# Patient Record
Sex: Female | Born: 1950
Health system: Southern US, Community
[De-identification: ages and names within clinical notes are randomized; demographics above are authoritative.]

## PROBLEM LIST (undated history)

## (undated) DIAGNOSIS — G5682 Other specified mononeuropathies of left upper limb: Secondary | ICD-10-CM

## (undated) DIAGNOSIS — Z9289 Personal history of other medical treatment: Secondary | ICD-10-CM

## (undated) DIAGNOSIS — R079 Chest pain, unspecified: Secondary | ICD-10-CM

## (undated) DIAGNOSIS — M069 Rheumatoid arthritis, unspecified: Secondary | ICD-10-CM

## (undated) HISTORY — DX: Rheumatoid arthritis, unspecified: M06.9

## (undated) HISTORY — DX: Chest pain, unspecified: R07.9

## (undated) HISTORY — PX: CARPAL TUNNEL RELEASE: SHX101

## (undated) HISTORY — PX: CORONARY ANGIOPLASTY: SHX604

## (undated) HISTORY — DX: Personal history of other medical treatment: Z92.89

## (undated) HISTORY — DX: Other specified mononeuropathies of left upper limb: G56.82

---

## 1998-06-03 ENCOUNTER — Other Ambulatory Visit: Admission: RE | Admit: 1998-06-03 | Discharge: 1998-06-03 | Payer: Self-pay | Admitting: Obstetrics and Gynecology

## 1998-10-21 ENCOUNTER — Other Ambulatory Visit: Admission: RE | Admit: 1998-10-21 | Discharge: 1998-10-21 | Payer: Self-pay | Admitting: Obstetrics and Gynecology

## 1999-03-14 ENCOUNTER — Other Ambulatory Visit: Admission: RE | Admit: 1999-03-14 | Discharge: 1999-03-14 | Payer: Self-pay | Admitting: Obstetrics and Gynecology

## 1999-07-14 ENCOUNTER — Other Ambulatory Visit: Admission: RE | Admit: 1999-07-14 | Discharge: 1999-07-14 | Payer: Self-pay | Admitting: Obstetrics and Gynecology

## 2000-06-07 ENCOUNTER — Other Ambulatory Visit: Admission: RE | Admit: 2000-06-07 | Discharge: 2000-06-07 | Payer: Self-pay | Admitting: Obstetrics and Gynecology

## 2001-01-26 ENCOUNTER — Other Ambulatory Visit: Admission: RE | Admit: 2001-01-26 | Discharge: 2001-01-26 | Payer: Self-pay | Admitting: Obstetrics and Gynecology

## 2001-05-30 ENCOUNTER — Other Ambulatory Visit: Admission: RE | Admit: 2001-05-30 | Discharge: 2001-05-30 | Payer: Self-pay | Admitting: Obstetrics and Gynecology

## 2002-06-12 ENCOUNTER — Other Ambulatory Visit: Admission: RE | Admit: 2002-06-12 | Discharge: 2002-06-12 | Payer: Self-pay | Admitting: Obstetrics and Gynecology

## 2002-12-26 ENCOUNTER — Encounter: Admission: RE | Admit: 2002-12-26 | Discharge: 2002-12-26 | Payer: Self-pay | Admitting: Sports Medicine

## 2003-06-15 ENCOUNTER — Other Ambulatory Visit: Admission: RE | Admit: 2003-06-15 | Discharge: 2003-06-15 | Payer: Self-pay | Admitting: Obstetrics and Gynecology

## 2004-06-16 ENCOUNTER — Other Ambulatory Visit: Admission: RE | Admit: 2004-06-16 | Discharge: 2004-06-16 | Payer: Self-pay | Admitting: Obstetrics and Gynecology

## 2004-12-10 ENCOUNTER — Encounter (INDEPENDENT_AMBULATORY_CARE_PROVIDER_SITE_OTHER): Payer: Self-pay | Admitting: Specialist

## 2004-12-10 ENCOUNTER — Ambulatory Visit (HOSPITAL_BASED_OUTPATIENT_CLINIC_OR_DEPARTMENT_OTHER): Admission: RE | Admit: 2004-12-10 | Discharge: 2004-12-10 | Payer: Self-pay | Admitting: Orthopedic Surgery

## 2004-12-10 ENCOUNTER — Ambulatory Visit (HOSPITAL_COMMUNITY): Admission: RE | Admit: 2004-12-10 | Discharge: 2004-12-10 | Payer: Self-pay | Admitting: Orthopedic Surgery

## 2004-12-28 HISTORY — PX: TENDON RELEASE: SHX230

## 2006-02-25 ENCOUNTER — Encounter: Admission: RE | Admit: 2006-02-25 | Discharge: 2006-02-25 | Payer: Self-pay | Admitting: *Deleted

## 2006-07-07 ENCOUNTER — Other Ambulatory Visit: Admission: RE | Admit: 2006-07-07 | Discharge: 2006-07-07 | Payer: Self-pay | Admitting: Obstetrics & Gynecology

## 2008-03-30 ENCOUNTER — Other Ambulatory Visit: Admission: RE | Admit: 2008-03-30 | Discharge: 2008-03-30 | Payer: Self-pay | Admitting: Obstetrics & Gynecology

## 2008-05-30 ENCOUNTER — Ambulatory Visit: Payer: Self-pay | Admitting: Sports Medicine

## 2008-05-30 DIAGNOSIS — M7750 Other enthesopathy of unspecified foot: Secondary | ICD-10-CM | POA: Insufficient documentation

## 2008-05-30 DIAGNOSIS — M069 Rheumatoid arthritis, unspecified: Secondary | ICD-10-CM | POA: Insufficient documentation

## 2009-06-20 ENCOUNTER — Ambulatory Visit: Payer: Self-pay | Admitting: Sports Medicine

## 2009-06-20 DIAGNOSIS — S838X9A Sprain of other specified parts of unspecified knee, initial encounter: Secondary | ICD-10-CM | POA: Insufficient documentation

## 2009-06-20 DIAGNOSIS — S86819A Strain of other muscle(s) and tendon(s) at lower leg level, unspecified leg, initial encounter: Secondary | ICD-10-CM

## 2011-05-15 NOTE — Op Note (Signed)
NAME:  Barbara Ayers, Barbara Ayers                  ACCOUNT NO.:  [id number]   MEDICAL RECORD NO.:  [id number]          PATIENT TYPE:  AMB   LOCATION:  DSC                          FACILITY:  [facility]   PHYSICIAN:  Cindee Salt, M.D.       DATE OF BIRTH:  10/19/1951   DATE OF PROCEDURE:  12/10/2004  DATE OF DISCHARGE:                                 OPERATIVE REPORT   PREOPERATIVE DIAGNOSIS:  Extensor tenosynovitis, right wrist.   POSTOPERATIVE DIAGNOSIS:  Extensor tenosynovitis, right wrist.   OPERATION:  Extensor tenosynovectomy, third and fourth dorsal compartment of  right wrist.   SURGEON:  Cindee Salt, M.D.   Threasa HeadsCarolyne Fiscal.   ANESTHESIA:  IV regional.   HISTORY:  The patient is a 60 year old female with a history of rheumatoid  arthritis, who has had swelling of the extensor tendons of her right wrist  that has not responded to conservative treatment.   PROCEDURE:  The patient was brought to the operating room, where an upper  arm IV regional anesthetic was carried out without difficulty.  She was  prepped using DuraPrep in the supine position, right arm free.  The limb was  exsanguinated with an Esmarch bandage, the tourniquet placed high in the arm  was inflated to 250 mmHg.  A longitudinal incision was made over the dorsal  aspect of her wrist, carried down through subcutaneous tissue.  Significant  swelling of the tenosynovial tissue was immediately apparent both proximal  and distal to the extensor retinaculum.  The retinaculum was opened  distally, proximally.  The synovial tissue was then removed with blunt and  sharp dissection with the use of a rongeur, stripping each of the extensor  tendons from the third and fourth dorsal compartment.  There was  infiltration into the middle finger extensor tendon.  This was removed as  much as possible.  This was done both proximally and distally.  The wound  was copiously irrigated with saline.  A vessel loop was placed through the  depths  of the wound and the skin closed with subcuticular 4-0 Monocryl  suture.  Steri-Strips were applied.  A sterile compressive dressing and  splint were applied.  The patient tolerated the procedure well and was taken  to the recovery room for observation in satisfactory condition.  She will be  discharged on Vicodin.  It should be noted that cultures were taken for  aerobic and aerobic, fungal cultures.  Rice bodies were noted on opening of  the synovium.       GK/MEDQ  D:  12/10/2004  T:  12/10/2004  Job:  [id number]

## 2011-08-14 LAB — HM MAMMOGRAPHY: HM Mammogram: NEGATIVE

## 2011-09-15 ENCOUNTER — Ambulatory Visit (INDEPENDENT_AMBULATORY_CARE_PROVIDER_SITE_OTHER): Payer: BC Managed Care – PPO | Admitting: Sports Medicine

## 2011-09-15 VITALS — BP 110/70

## 2011-09-15 DIAGNOSIS — M79673 Pain in unspecified foot: Secondary | ICD-10-CM

## 2011-09-15 DIAGNOSIS — M069 Rheumatoid arthritis, unspecified: Secondary | ICD-10-CM

## 2011-09-15 DIAGNOSIS — M7750 Other enthesopathy of unspecified foot: Secondary | ICD-10-CM

## 2011-09-15 DIAGNOSIS — M79609 Pain in unspecified limb: Secondary | ICD-10-CM

## 2011-09-15 NOTE — Assessment & Plan Note (Signed)
Stable on MTX  Needs orthotics to cont her exercise program

## 2011-09-15 NOTE — Assessment & Plan Note (Signed)
Patient was fitted for a standard, cushioned, semi-rigid orthotic.  The orthotic was heated and the patient stood on the orthotic blank positioned on the orthotic stand. The patient was positioned in subtalar neutral position and 10 degrees of ankle dorsiflexion in a weight bearing stance. After molding, a stable Fast-Tech EVA base was applied to the orthotic blank.   The blank was ground to a stable position for weight bearing. Size: 10 blue swirl base: Blue EVA posting: first ray left additional orthotic padding: MT pads  Time 40 mins  Gait post orthotics is neutral in walking and running and she has good comfort  RTC prn

## 2011-09-15 NOTE — Progress Notes (Signed)
  Subjective:    Patient ID: KAELEY VINJE, female    DOB: 1951/09/27, 60 y.o.   MRN: [id number]  HPI 60 year old F with hx of: Bilateral foot pain Metatarsalgia Rheumatoid arthritis  About 4 years ago we put her into MT pads and soft orthotics This allowed her to run and walk without flares of her RA  3 years and 3 mos ago placed in custom orthotic She has done very well with these Able to walk/run 30 MPW with these Gets recurrent Foot pain if not using these   Review of Systems     Objective:   Physical Exam NAD  Loss of transverse arch bilat Less morton's callus than in past Leg length equal  Arch shape is somewhat cavus  No ankle effusions No swelling of MTP joints today  Mild hypertrophy of MCP 2 and 3 bilat       Assessment & Plan:

## 2011-09-15 NOTE — Assessment & Plan Note (Signed)
MT pads added to current orthotics  Cont these in all walking and exerc shoes

## 2012-08-25 LAB — HM PAP SMEAR: HM Pap smear: NEGATIVE

## 2013-08-29 ENCOUNTER — Ambulatory Visit: Payer: Self-pay | Admitting: Nurse Practitioner

## 2013-09-22 ENCOUNTER — Encounter: Payer: Self-pay | Admitting: Nurse Practitioner

## 2013-09-25 ENCOUNTER — Telehealth: Payer: Self-pay | Admitting: Nurse Practitioner

## 2013-09-25 ENCOUNTER — Ambulatory Visit (INDEPENDENT_AMBULATORY_CARE_PROVIDER_SITE_OTHER): Payer: BC Managed Care – PPO | Admitting: Nurse Practitioner

## 2013-09-25 ENCOUNTER — Encounter: Payer: Self-pay | Admitting: Nurse Practitioner

## 2013-09-25 VITALS — BP 120/66 | HR 68 | Resp 16 | Ht 67.25 in | Wt 138.0 lb

## 2013-09-25 DIAGNOSIS — Z01419 Encounter for gynecological examination (general) (routine) without abnormal findings: Secondary | ICD-10-CM

## 2013-09-25 DIAGNOSIS — Z1211 Encounter for screening for malignant neoplasm of colon: Secondary | ICD-10-CM

## 2013-09-25 DIAGNOSIS — Z0000 Encounter for general adult medical examination without abnormal findings: Secondary | ICD-10-CM

## 2013-09-25 LAB — LIPID PANEL
Cholesterol: 163 mg/dL (ref 0–200)
HDL: 56 mg/dL (ref >39)
Triglycerides: 82 mg/dL (ref <150)
VLDL: 16 mg/dL (ref 0–40)

## 2013-09-25 LAB — TSH: TSH: 2.623 u[IU]/mL (ref 0.350–4.500)

## 2013-09-25 NOTE — Progress Notes (Signed)
Patient ID: Barbara Ayers, female   DOB: [birth year], 62 y.o.   MRN: [id number] 62 y.o. G2P2002 Divorced Caucasian Fe here for annual exam.   No vaso symptoms. Not sexually active. Doing OK since divorce. Wants labs done. She is also concerned about her BMD, her younger sister was diagnosed with borderline osteoporosis and is no Reclast.  No LMP recorded. Patient is postmenopausal.          Sexually active: no  The current method of family planning is abstinence.    Exercising: yes  Gym routine that includes yoga once a week and home routine that includes jogging and walking everyday. Smoker:  no  Health Maintenance: Pap:  8.29.13, WNL, neg HR HPV MMG:  08/14/11, BI-Rads 1: negative Colonoscopy:  never BMD:   4/115/09, normal TDaP:  05/2009 Labs: would like cholesterol checked today.  Declined iron and urine check.   reports that she has never smoked. She has never used smokeless tobacco. She reports that she does not drink alcohol or use illicit drugs.  Past Medical History  Diagnosis Date  . Tendonitis     hands  . RA (rheumatoid arthritis)   . Transfusion history     Past Surgical History  Procedure Laterality Date  . Tendon release Right 2006  . Carpal tunnel release      Current Outpatient Prescriptions  Medication Sig Dispense Refill  . Ascorbic Acid (VITAMIN C) 100 MG tablet Take 100 mg by mouth daily.      Marland Kitchen CALCIUM PO Take 1 tablet by mouth daily.      . Cholecalciferol (VITAMIN D) 2000 UNITS CAPS Take by mouth.      . cycloSPORINE (RESTASIS) 0.05 % ophthalmic emulsion 1 drop 2 (two) times daily.      . fish oil-omega-3 fatty acids 1000 MG capsule Take 2 g by mouth daily.      . folic acid (FOLVITE) 1 MG tablet Take 1 mg by mouth daily.      . methotrexate (RHEUMATREX) 2.5 MG tablet Take 10 mg by mouth once a week. Caution:Chemotherapy. Protect from light.       No current facility-administered medications for this visit.    Family History  Problem Relation Age of  Onset  . Breast cancer Maternal Grandmother 36  . Diabetes Other   . Heart disease Other     ROS:  Pertinent items are noted in HPI.  Otherwise, a comprehensive ROS was negative.  Exam:   BP 120/66  Pulse 68  Resp 16  Ht 5' 7.25" (1.708 m)  Wt 138 lb (62.596 kg)  BMI 21.46 kg/m2 Height: 5' 7.25" (170.8 cm)  Ht Readings from Last 3 Encounters:  09/25/13 5' 7.25" (1.708 m)    General appearance: alert, cooperative and appears stated age Head: Normocephalic, without obvious abnormality, atraumatic Neck: no adenopathy, supple, symmetrical, trachea midline and thyroid normal to inspection and palpation Lungs: clear to auscultation bilaterally Breasts: normal appearance, no masses or tenderness Heart: regular rate and rhythm Abdomen: soft, non-tender; no masses,  no organomegaly Extremities: extremities normal, atraumatic, no cyanosis or edema Skin: Skin color, texture, turgor normal. No rashes or lesions Lymph nodes: Cervical, supraclavicular, and axillary nodes normal. No abnormal inguinal nodes palpated Neurologic: Grossly normal   Pelvic: External genitalia:  no lesions              Urethra:  normal appearing urethra with no masses, tenderness or lesions  Bartholin's and Skene's: normal                 Vagina: normal appearing vagina with normal color and discharge, no lesions              Cervix: anteverted              Pap taken: no Bimanual Exam:  Uterus:  normal size, contour, position, consistency, mobility, non-tender              Adnexa: no mass, fullness, tenderness               Rectovaginal: Confirms               Anus:  normal sphincter tone, no lesions  A:  Well Woman with normal exam   Postmenopausal no HRT  History of RA  P:   Pap smear as per guidelines   IFOB - since patient declines colonoscopy  Mammogram is due and patient will schedule along with BMD  Counseled on breast self exam, adequate intake of calcium and vitamin D, diet and  exercise, Kegel's exercises return annually or prn  An After Visit Summary was printed and given to the patient.

## 2013-09-25 NOTE — Telephone Encounter (Signed)
Calling to get order faxed over for bone density scan to [facility]. Appointment is scheduled today for 2:30 for mammogram and bone density.

## 2013-09-25 NOTE — Telephone Encounter (Signed)
Last BMD 03-2008. Last MMG 07-2011. Order sent to [facility] to sign.

## 2013-09-25 NOTE — Patient Instructions (Addendum)

## 2013-09-26 NOTE — Progress Notes (Signed)
Reviewed personally.  M. Suzanne Rondell Pardon, MD.  

## 2013-09-27 ENCOUNTER — Telehealth: Payer: Self-pay | Admitting: *Deleted

## 2013-09-27 NOTE — Telephone Encounter (Signed)
I have attempted to contact this patient by phone with the following results: left message to return my call on answering machine ([phone] per ROI).

## 2013-09-27 NOTE — Telephone Encounter (Signed)
Message copied by Luisa Dago on Wed Sep 27, 2013  1:49 PM ------      Message from: Ria Comment R      Created: Tue Sep 26, 2013  9:25 AM       Let patient know all normal ------

## 2013-09-27 NOTE — Telephone Encounter (Signed)
Opened in error

## 2013-09-28 NOTE — Telephone Encounter (Signed)
Patient returning Stephanie's call.

## 2013-09-28 NOTE — Telephone Encounter (Signed)
Detailed message left on home voicemail per patient.

## 2013-09-28 NOTE — Telephone Encounter (Signed)
Patient called back Barbara Ayers and wanted to let her know it is fine to just leave a message on her answering machine.

## 2013-10-04 NOTE — Telephone Encounter (Signed)
Patient is calling to see if we have results from a bone density test.

## 2013-10-04 NOTE — Telephone Encounter (Signed)
Patty have you received results? I cannot see any in epic.

## 2013-10-09 NOTE — Telephone Encounter (Signed)
LMTCB for results. aa 

## 2013-10-09 NOTE — Telephone Encounter (Signed)
Barbara Ayers, I placed results on your desk for your review.

## 2013-10-09 NOTE — Telephone Encounter (Signed)
Patient is calling about bone density results. °

## 2013-10-09 NOTE — Telephone Encounter (Signed)
Patient is calling about a bone density test results

## 2013-10-09 NOTE — Telephone Encounter (Signed)
Patient given message from Lauro Franklin, FNP. Verbalized understanding and will follow up prn.

## 2013-10-09 NOTE — Telephone Encounter (Signed)
I have not seen results.  I will call [facility] to see if they have it to fax.   Message left to return call to [place] at [phone].

## 2013-10-09 NOTE — Telephone Encounter (Signed)
The BMD of this patient is normal.  She needs to continue with weight bearing exercise and OTC Vit D.  Repeat again in 5 years per [place].

## 2013-10-23 ENCOUNTER — Telehealth: Payer: Self-pay | Admitting: *Deleted

## 2013-10-23 NOTE — Telephone Encounter (Signed)
I have attempted to contact this patient by phone with the following results: left message to return my call on answering machine (home per ROI).  

## 2013-10-23 NOTE — Telephone Encounter (Signed)
Pt notified of BMD results per note on hardcopy.  Hardcopy sent to scanning.

## 2013-10-23 NOTE — Telephone Encounter (Signed)
Patient is returning Stephanie's call

## 2013-11-02 ENCOUNTER — Other Ambulatory Visit: Payer: Self-pay

## 2014-05-10 ENCOUNTER — Encounter: Payer: Self-pay | Admitting: Family Medicine

## 2014-05-10 ENCOUNTER — Encounter: Payer: Self-pay | Admitting: Nurse Practitioner

## 2014-05-10 ENCOUNTER — Ambulatory Visit (INDEPENDENT_AMBULATORY_CARE_PROVIDER_SITE_OTHER): Payer: BC Managed Care – PPO | Admitting: Family Medicine

## 2014-05-10 VITALS — BP 112/71 | HR 88 | Ht 67.0 in | Wt 139.0 lb

## 2014-05-10 DIAGNOSIS — M79609 Pain in unspecified limb: Secondary | ICD-10-CM

## 2014-05-10 DIAGNOSIS — M79672 Pain in left foot: Secondary | ICD-10-CM

## 2014-05-10 NOTE — Patient Instructions (Signed)
Your ultrasound and exam are negative for a stress fracture. This is due to tarso-metatarsal arthritis. Swing your orthotics by here or the [place] office when you get a chance (call in [place] first if you decide to swing them by there) to make sure they're still in good shape. Take tylenol 500mg  1-2 tabs three times a day for pain. Aleve 1-2 tabs twice a day with food Glucosamine sulfate 750mg  twice a day is a supplement that may help. Capsaicin topically up to four times a day may also help with pain. Cortisone shots have mixed efficacy in this area. It's important that you continue to stay active. Heat or ice 15 minutes at a time 3-4 times a day as needed to help with pain. Follow up as needed.

## 2014-05-14 ENCOUNTER — Encounter: Payer: Self-pay | Admitting: Family Medicine

## 2014-05-14 DIAGNOSIS — M79672 Pain in left foot: Secondary | ICD-10-CM | POA: Insufficient documentation

## 2014-05-14 NOTE — Progress Notes (Signed)
Patient ID: AVAMAE DEHAAN, female   DOB: 1951/04/29, 63 y.o.   MRN: [id number]  PCP: Hollice Espy, MD  Subjective:   HPI: Patient is a 63 y.o. female here for left foot pain.  Patient reports she typically does about 10 miles a week of walk/jogging. About 2 months ago started to get pain in 1st metatarsal area. Worse with running and brisk walking. Felt a knot here as well. Not tried any medicines. Has rested from usual exercise routine since then. No swelling, bruising otherwise. No history of stress fracture.  No dx of osteoporosis.  Past Medical History  Diagnosis Date  . RA (rheumatoid arthritis)   . Transfusion history     Current Outpatient Prescriptions on File Prior to Visit  Medication Sig Dispense Refill  . Ascorbic Acid (VITAMIN C) 100 MG tablet Take 100 mg by mouth daily.      Marland Kitchen CALCIUM PO Take 1 tablet by mouth daily.      . Cholecalciferol (VITAMIN D) 2000 UNITS CAPS Take by mouth.      . cycloSPORINE (RESTASIS) 0.05 % ophthalmic emulsion 1 drop 2 (two) times daily.      . fish oil-omega-3 fatty acids 1000 MG capsule Take 2 g by mouth daily.      . folic acid (FOLVITE) 1 MG tablet Take 1 mg by mouth daily.      . methotrexate (RHEUMATREX) 2.5 MG tablet Take 10 mg by mouth once a week. Caution:Chemotherapy. Protect from light.       No current facility-administered medications on file prior to visit.    Past Surgical History  Procedure Laterality Date  . Tendon release Right 2006  . Carpal tunnel release      Allergies  Allergen Reactions  . Amoxicillin Nausea And Vomiting    History   Social History  . Marital Status: Married    Spouse Name: N/A    Number of Children: N/A  . Years of Education: N/A   Occupational History  . Not on file.   Social History Main Topics  . Smoking status: Never Smoker   . Smokeless tobacco: Never Used  . Alcohol Use: No  . Drug Use: No  . Sexual Activity: No   Other Topics Concern  . Not on file    Social History Narrative  . No narrative on file    Family History  Problem Relation Age of Onset  . Breast cancer Maternal Grandmother 34  . Diabetes Other   . Heart disease Other   . Heart failure Mother   . Heart disease Mother   . Lung cancer Mother 62    smoker  . Heart disease Father     BP 112/71  Pulse 88  Ht 5\' 7"  (1.702 m)  Wt 139 lb (63.05 kg)  BMI 21.77 kg/m2  Review of Systems: See HPI above.    Objective:  Physical Exam:  Gen: NAD  Left foot/ankle: No gross deformity, swelling, ecchymoses FROM ankle without pain. TTP 1st TMT joint, less proximal 1st MT. Negative ant drawer and talar tilt.   Negative syndesmotic compression. Thompsons test negative. Negative hop test, fulcrum.  Mild pain metatarsal squeeze. NV intact distally.  MSK u/s:  No evidence cortical irregularity, edema, neovascularity of 1st or 2nd metatarsals.    Assessment & Plan:  1. Left foot pain - ultrasound negative for stress fracture.  Location, history, exam all consistent with flare of TMT arthritis.  Reassured patient.  Use orthotics (advised to bring  these in to ensure they haven't broken down).  Tylenol, nsaids, glucosamine, capsaicin reviewed.  Activities as tolerated.  F/u prn.

## 2014-05-14 NOTE — Assessment & Plan Note (Signed)
ultrasound negative for stress fracture.  Location, history, exam all consistent with flare of TMT arthritis.  Reassured patient.  Use orthotics (advised to bring these in to ensure they haven't broken down).  Tylenol, nsaids, glucosamine, capsaicin reviewed.  Activities as tolerated.  F/u prn.

## 2014-07-26 ENCOUNTER — Encounter: Payer: Self-pay | Admitting: Internal Medicine

## 2014-08-27 ENCOUNTER — Telehealth: Payer: Self-pay

## 2014-08-27 ENCOUNTER — Ambulatory Visit (AMBULATORY_SURGERY_CENTER): Payer: Self-pay

## 2014-08-27 VITALS — Ht 67.0 in | Wt 141.4 lb

## 2014-08-27 DIAGNOSIS — Z1211 Encounter for screening for malignant neoplasm of colon: Secondary | ICD-10-CM

## 2014-08-27 MED ORDER — MOVIPREP 100 G PO SOLR: 1.0000 | Freq: Once | ORAL | Status: DC

## 2014-08-27 NOTE — Telephone Encounter (Signed)
Pt can get carepartner to go over discharge instructions and drive her home but can not stay 2-3hrs

## 2014-08-27 NOTE — Progress Notes (Signed)
No allergies to eggs or soy No home oxygen No diet/weight loss meds No past problems with anesthesia  Has email  Emmi instructions given for colonoscopy 

## 2014-08-28 NOTE — Telephone Encounter (Signed)
Left a message for patient to call back. 

## 2014-08-29 NOTE — Telephone Encounter (Signed)
Spoke with patient and informed her that the hospital also requires a care partner to stay during procedure. Patient states she wants to cancel the procedure at [facility] and does not want to reschedule. Cancelled procedure.

## 2014-08-30 NOTE — Addendum Note (Signed)
Addended by: Maple Hudson on: 08/30/2014 03:13 PM   Modules accepted: Level of Service

## 2014-09-10 ENCOUNTER — Encounter: Payer: BC Managed Care – PPO | Admitting: Internal Medicine

## 2014-09-27 ENCOUNTER — Ambulatory Visit: Payer: BC Managed Care – PPO | Admitting: Nurse Practitioner

## 2014-10-03 ENCOUNTER — Ambulatory Visit: Payer: BC Managed Care – PPO | Admitting: Nurse Practitioner

## 2014-10-04 ENCOUNTER — Encounter: Payer: Self-pay | Admitting: Nurse Practitioner

## 2014-10-04 ENCOUNTER — Ambulatory Visit (INDEPENDENT_AMBULATORY_CARE_PROVIDER_SITE_OTHER): Payer: BC Managed Care – PPO | Admitting: Nurse Practitioner

## 2014-10-04 VITALS — BP 100/60 | HR 72 | Resp 18 | Ht 67.5 in | Wt 140.0 lb

## 2014-10-04 DIAGNOSIS — Z01419 Encounter for gynecological examination (general) (routine) without abnormal findings: Secondary | ICD-10-CM

## 2014-10-04 DIAGNOSIS — Z0000 Encounter for general adult medical examination without abnormal findings: Secondary | ICD-10-CM

## 2014-10-04 NOTE — Patient Instructions (Signed)

## 2014-10-04 NOTE — Progress Notes (Signed)
63 y.o. G59P2002 Divorced Caucasian Fe here for annual exam.  No new health problems.  Still followed closely by Rheumatologist for RA.  Still works 2 days a week.  Patient's last menstrual period was 08/10/1998.          Sexually active: No.  The current method of family planning is post menopausal status.    Exercising: Yes.    Run, Walk yoga daily Smoker:  no  Health Maintenance: Pap:  08/2012 Neg. HR HPV: Neg MMG:  08/2013 BIRADS1: Neg - will schedule Colonoscopy:  Scheduled 10/09/14 BMD:   09/25/2013 TDaP:  2010 Labs: will return   reports that she has never smoked. She has never used smokeless tobacco. She reports that she does not drink alcohol or use illicit drugs.  Past Medical History  Diagnosis Date  . RA (rheumatoid arthritis)   . Transfusion history     Past Surgical History  Procedure Laterality Date  . Tendon release Right 2006  . Carpal tunnel release      Current Outpatient Prescriptions  Medication Sig Dispense Refill  . CALCIUM PO Take 1 tablet by mouth daily.      . Cholecalciferol (VITAMIN D) 2000 UNITS CAPS Take by mouth.      . cycloSPORINE (RESTASIS) 0.05 % ophthalmic emulsion 1 drop 2 (two) times daily.      . fish oil-omega-3 fatty acids 1000 MG capsule Take 2 g by mouth daily.      . folic acid (FOLVITE) 1 MG tablet Take 1 mg by mouth daily.      . methotrexate (RHEUMATREX) 2.5 MG tablet Take 10 mg by mouth once a week. Caution:Chemotherapy. Protect from light.      Marland Kitchen MOVIPREP 100 G SOLR Take 1 kit (200 g total) by mouth once.  1 kit  0   No current facility-administered medications for this visit.    Family History  Problem Relation Age of Onset  . Breast cancer Maternal Grandmother 58  . Diabetes Other   . Heart disease Other   . Heart failure Mother   . Heart disease Mother   . Lung cancer Mother 50    smoker  . Heart disease Father   . Colon cancer Neg Hx   . Pancreatic cancer Neg Hx   . Stomach cancer Neg Hx   . Rectal cancer Neg Hx      ROS:  Pertinent items are noted in HPI.  Otherwise, a comprehensive ROS was negative.  Exam:   BP 100/60  Pulse 72  Resp 18  Ht 5' 7.5" (1.715 m)  Wt 140 lb (63.504 kg)  BMI 21.59 kg/m2  LMP 08/10/1998 Height: 5' 7.5" (171.5 cm)  Ht Readings from Last 3 Encounters:  10/04/14 5' 7.5" (1.715 m)  08/27/14 _0  (1.702 m)  05/10/14 _1  (1.702 m)    General appearance: alert, cooperative and appears stated age Head: Normocephalic, without obvious abnormality, atraumatic Neck: no adenopathy, supple, symmetrical, trachea midline and thyroid normal to inspection and palpation Lungs: clear to auscultation bilaterally Breasts: normal appearance, no masses or tenderness Heart: regular rate and rhythm Abdomen: soft, non-tender; no masses,  no organomegaly Extremities: extremities normal, atraumatic, no cyanosis or edema Skin: Skin color, texture, turgor normal. No rashes or lesions Lymph nodes: Cervical, supraclavicular, and axillary nodes normal. No abnormal inguinal nodes palpated Neurologic: Grossly normal   Pelvic: External genitalia:  no lesions              Urethra:  normal  appearing urethra with no masses, tenderness or lesions              Bartholin's and Skene's: normal                 Vagina: normal appearing vagina with normal color and discharge, no lesions              Cervix: anteverted              Pap taken: No. Bimanual Exam:  Uterus:  normal size, contour, position, consistency, mobility, non-tender              Adnexa: no mass, fullness, tenderness               Rectovaginal: Confirms               Anus:  normal sphincter tone, no lesions  A:  Well Woman with normal exam  Postmenopausal no HRT   History of RA  P:   Reviewed health and wellness pertinent to exam  Pap smear taken today  Mammogram is due now and will schedule  Will return for fasting labs.  Will get colonoscopy as scheduled  Counseled on breast self exam, mammography screening, adequate  intake of calcium and vitamin D, diet and exercise return annually or prn  An After Visit Summary was printed and given to the patient.

## 2014-10-08 ENCOUNTER — Other Ambulatory Visit (INDEPENDENT_AMBULATORY_CARE_PROVIDER_SITE_OTHER): Payer: BC Managed Care – PPO

## 2014-10-08 DIAGNOSIS — Z0000 Encounter for general adult medical examination without abnormal findings: Secondary | ICD-10-CM

## 2014-10-09 ENCOUNTER — Other Ambulatory Visit: Payer: Self-pay | Admitting: Gastroenterology

## 2014-10-09 LAB — BASIC METABOLIC PANEL
BUN: 19 mg/dL (ref 6–23)
CHLORIDE: 103 meq/L (ref 96–112)
CO2: 27 meq/L (ref 19–32)
CREATININE: 0.89 mg/dL (ref 0.50–1.10)
Calcium: 9.7 mg/dL (ref 8.4–10.5)
GLUCOSE: 82 mg/dL (ref 70–99)
POTASSIUM: 4.8 meq/L (ref 3.5–5.3)
Sodium: 141 mEq/L (ref 135–145)

## 2014-10-09 LAB — LIPID PANEL
Cholesterol: 180 mg/dL (ref 0–200)
HDL: 63 mg/dL (ref >39)
LDL Cholesterol: 98 mg/dL (ref 0–99)
Total CHOL/HDL Ratio: 2.9 Ratio
Triglycerides: 93 mg/dL (ref <150)
VLDL: 19 mg/dL (ref 0–40)

## 2014-10-09 LAB — TSH: TSH: 2.934 u[IU]/mL (ref 0.350–4.500)

## 2014-10-09 LAB — VITAMIN D 25 HYDROXY (VIT D DEFICIENCY, FRACTURES): Vit D, 25-Hydroxy: 53 ng/mL (ref 30–89)

## 2014-10-09 LAB — HM COLONOSCOPY

## 2014-10-09 NOTE — Progress Notes (Signed)
Encounter reviewed by Dr. Brook Silva.  

## 2014-10-12 ENCOUNTER — Other Ambulatory Visit: Payer: Self-pay

## 2014-10-15 ENCOUNTER — Telehealth: Payer: Self-pay | Admitting: Nurse Practitioner

## 2014-10-15 NOTE — Telephone Encounter (Signed)
Pt calling to see if results are back yet

## 2014-10-16 NOTE — Telephone Encounter (Signed)
Entered by Lauro Franklin, FNP at 10/09/2014 1:20 PM Barbara Ayers, good news! - labs all look great. The cholesterol panel is fabulous. The liver, kidney, glucose, thyroid, and Vit D test are great. Good work on healthy life style.

## 2014-10-16 NOTE — Telephone Encounter (Signed)
Left Message To Call Back  

## 2014-10-17 ENCOUNTER — Other Ambulatory Visit: Payer: BC Managed Care – PPO

## 2014-10-18 NOTE — Telephone Encounter (Signed)
Left Message To Call Back  

## 2014-10-22 NOTE — Telephone Encounter (Signed)
Am I missing something in this conversation.  She just did labs and they were great.  So what labs is she referring to?

## 2014-10-22 NOTE — Telephone Encounter (Signed)
S/w patient she said she already got her lab results in the mail? Patient states she would like to come in 2 weeks before her AEX to have bloodwork done that way she can discuss it with Ms. Patty at her AEX. Told patient that I would pass on the message to Ms. Patty for her.  Routed to provider for review, encounter closed.

## 2014-10-22 NOTE — Telephone Encounter (Signed)
She's talking about future AEX appointments she'd like to come in 2 weeks ahead of time and get her labs drawn so she can discuss them at her AEX.

## 2014-10-23 NOTE — Telephone Encounter (Signed)
Ok to plan that but we still need a phone call from her 2 weeks prior to let us schedule the appoiintment for lab and put in orders.

## 2014-10-23 NOTE — Telephone Encounter (Signed)
Yes ma'am! 

## 2014-10-29 ENCOUNTER — Encounter: Payer: Self-pay | Admitting: Nurse Practitioner

## 2015-10-09 ENCOUNTER — Encounter: Payer: Self-pay | Admitting: Nurse Practitioner

## 2015-10-09 ENCOUNTER — Ambulatory Visit (INDEPENDENT_AMBULATORY_CARE_PROVIDER_SITE_OTHER): Payer: BC Managed Care – PPO | Admitting: Nurse Practitioner

## 2015-10-09 VITALS — BP 94/62 | HR 60 | Resp 16 | Ht 67.25 in | Wt 132.0 lb

## 2015-10-09 DIAGNOSIS — Z01419 Encounter for gynecological examination (general) (routine) without abnormal findings: Secondary | ICD-10-CM

## 2015-10-09 DIAGNOSIS — Z0000 Encounter for general adult medical examination without abnormal findings: Secondary | ICD-10-CM

## 2015-10-09 LAB — HEMOGLOBIN, FINGERSTICK: Hemoglobin, fingerstick: 13.3 g/dL (ref 12.0–16.0)

## 2015-10-09 LAB — POCT URINALYSIS DIPSTICK
BILIRUBIN UA: NEGATIVE
GLUCOSE UA: NEGATIVE
Ketones, UA: NEGATIVE
NITRITE UA: NEGATIVE
Protein, UA: NEGATIVE
Urobilinogen, UA: NEGATIVE
pH, UA: 6

## 2015-10-09 LAB — LIPID PANEL
CHOL/HDL RATIO: 2.6 ratio (ref <=5.0)
Cholesterol: 171 mg/dL (ref 125–200)
HDL: 66 mg/dL (ref >=46)
LDL CALC: 89 mg/dL (ref <130)
TRIGLYCERIDES: 81 mg/dL (ref <150)
VLDL: 16 mg/dL (ref <30)

## 2015-10-09 LAB — TSH: TSH: 3.428 u[IU]/mL (ref 0.350–4.500)

## 2015-10-09 NOTE — Patient Instructions (Signed)

## 2015-10-09 NOTE — Progress Notes (Signed)
Patient ID: Barbara Ayers, female   DOB: 1951/10/05, 64 y.o.   MRN: [id number] 64 y.o. G2P2002 Divorced  Caucasian Fe here for annual exam.  Still working 2-3 days a week  Patient's last menstrual period was 08/10/1998 (exact date).          Sexually active: No.  The current method of family planning is none.    Exercising: Yes.    Home exercise routine includes walkingm, jogging and yoga. Smoker:  no  Health Maintenance: Pap: 08/25/12, Negative with neg HR HPV MMG: 01/30/15 3D with 3D Diagnostic Left MMG and Ultrasound;  Bi-Rads 1:  Negative Colonoscopy: 10/09/14, benign polyp, repeat in 5 years BMD: 09/25/2013, 0.6 S / -0.3 R / -0.3 L TDaP: 2010 Shingles:  05/29/2011 Labs:  HB:  13.3  Urine:  Trace RBC, 1+ Leuk's       (asymptomatic)   reports that she has never smoked. She has never used smokeless tobacco. She reports that she does not drink alcohol or use illicit drugs.  Past Medical History  Diagnosis Date  . RA (rheumatoid arthritis) (HCC)   . Transfusion history     Past Surgical History  Procedure Laterality Date  . Tendon release Right 2006  . Carpal tunnel release      Current Outpatient Prescriptions  Medication Sig Dispense Refill  . CALCIUM PO Take 1 tablet by mouth daily.    . Cholecalciferol (VITAMIN D) 2000 UNITS CAPS Take by mouth.    . cycloSPORINE (RESTASIS) 0.05 % ophthalmic emulsion 1 drop 2 (two) times daily.    . fish oil-omega-3 fatty acids 1000 MG capsule Take 2 g by mouth daily.    . folic acid (FOLVITE) 1 MG tablet Take 1 mg by mouth daily.    . methotrexate (RHEUMATREX) 2.5 MG tablet Take 10 mg by mouth once a week. Caution:Chemotherapy. Protect from light.     No current facility-administered medications for this visit.    Family History  Problem Relation Age of Onset  . Breast cancer Maternal Grandmother 75  . Diabetes Other   . Heart disease Other   . Heart failure Mother   . Heart disease Mother   . Lung cancer Mother 87    smoker  .  Heart disease Father   . Colon cancer Neg Hx   . Pancreatic cancer Neg Hx   . Stomach cancer Neg Hx   . Rectal cancer Neg Hx     ROS:  Pertinent items are noted in HPI.  Otherwise, a comprehensive ROS was negative.  Exam:   BP 94/62 mmHg  Pulse 60  Resp 16  Ht 5' 7.25" (1.708 m)  Wt 132 lb (59.875 kg)  BMI 20.52 kg/m2  LMP 08/10/1998 (Exact Date) Height: 5' 7.25" (170.8 cm) Ht Readings from Last 3 Encounters:  10/09/15 5' 7.25" (1.708 m)  10/04/14 5' 7.5" (1.715 m)  08/27/14 5\' 7"  (1.702 m)    General appearance: alert, cooperative and appears stated age Head: Normocephalic, without obvious abnormality, atraumatic Neck: no adenopathy, supple, symmetrical, trachea midline and thyroid normal to inspection and palpation Lungs: clear to auscultation bilaterally Breasts: normal appearance, no masses or tenderness Heart: regular rate and rhythm Abdomen: soft, non-tender; no masses,  no organomegaly Extremities: extremities normal, atraumatic, no cyanosis or edema Skin: Skin color, texture, turgor normal. No rashes or lesions Lymph nodes: Cervical, supraclavicular, and axillary nodes normal. No abnormal inguinal nodes palpated Neurologic: Grossly normal   Pelvic: External genitalia:  no lesions  Urethra:  normal appearing urethra with no masses, tenderness or lesions              Bartholin's and Skene's: normal                 Vagina: normal appearing vagina with normal color and discharge, no lesions              Cervix: anteverted              Pap taken: Yes.   Bimanual Exam:  Uterus:  normal size, contour, position, consistency, mobility, non-tender              Adnexa: no mass, fullness, tenderness               Rectovaginal: Confirms               Anus:  normal sphincter tone, no lesions  Chaperone present: no  A:  Well Woman with normal exam  Postmenopausal no HRT  History of RA - stable on med's   P:   Reviewed health and wellness  pertinent to exam  Pap smear as above  Mammogram is due 01/2016  Follow up with labs  Counseled on breast self exam, mammography screening, adequate intake of calcium and vitamin D, diet and exercise, Kegel's exercises return annually or prn  An After Visit Summary was printed and given to the patient.

## 2015-10-10 LAB — VITAMIN D 25 HYDROXY (VIT D DEFICIENCY, FRACTURES): Vit D, 25-Hydroxy: 48 ng/mL (ref 30–100)

## 2015-10-10 NOTE — Progress Notes (Signed)
Encounter reviewed by Dr. Brook Amundson C. Silva.  

## 2015-10-14 LAB — IPS PAP TEST WITH HPV

## 2015-10-16 ENCOUNTER — Telehealth: Payer: Self-pay | Admitting: Nurse Practitioner

## 2015-10-16 NOTE — Telephone Encounter (Signed)
Spoke with patient. All results given as seen below. Patient is agreeable and verbalizes understanding. Would like a copy of the results. Advised these are available via her mychart for printing if she would like. Patient preferes to pick up a copy from the office. Labs from 10/09/2015 printed and placed at the front desk for patient pick up. Patient is agreeable.  Notes Recorded by Verner Chol, CNM on 10/10/2015 at 8:14 AM Notified by My Chart  Good Morning Barbara Ayers, Your vitamin D is normal, continue dietary sources. TSH(thyroid) is normal Lipid Panel looks great HGB. Normal Have a great day! Lovett Sox, CNM covering for [employer] to provider for final review. Patient agreeable to disposition. Will close encounter.

## 2015-10-16 NOTE — Telephone Encounter (Signed)
Patient calling for results.

## 2016-09-02 ENCOUNTER — Ambulatory Visit (INDEPENDENT_AMBULATORY_CARE_PROVIDER_SITE_OTHER): Payer: Medicare Other | Admitting: Sports Medicine

## 2016-09-02 VITALS — BP 110/70 | Ht 67.0 in | Wt 140.0 lb

## 2016-09-02 DIAGNOSIS — S73191A Other sprain of right hip, initial encounter: Secondary | ICD-10-CM

## 2016-09-02 NOTE — Progress Notes (Signed)
  Barbara Ayers - 65 y.o. female MRN [id number]  Date of birth: 07-26-1951  SUBJECTIVE:  Including CC & ROS.  Chief Complaint  Patient presents with  . Hip Pain   [name] is a 65 year old female that is presenting with right hip pain for the past 2-3 months. She feels like the pain has affected her gait and causing her to limp. The pain is most prominent with going upstairs. It started in a gradual duration. She also has some pain on the right lateral hip. The pain is worse when she is doing certain stretches. She has tried to new workout routines that seemed to affected her hip as well. She has worn orthotics in the past for her plantar fascia. She denies any radiculopathy. She has a history of rheumatoid arthritis for which she takes methotrexate.  ROS: No unexpected weight loss, fever, chills, swelling, instability, muscle pain, numbness/tingling, redness, otherwise see HPI    HISTORY: Past Medical, Surgical, Social, and Family History Reviewed & Updated per EMR.   Pertinent Historical Findings include: PMSHx -  RA, Carpal tunnel syndrome b/l,  PSHx -  No tobacco or alcohol use, retired  FHx -  Alzheimer's, Heart disease  Medications - Methotrexate    DATA REVIEWED: None to review   PHYSICAL EXAM:  VS: BP:110/70  HR: bpm  TEMP: ( )  RESP:   HT:5\' 7"  (170.2 cm)   WT:140 lb (63.5 kg)  BMI:22 PHYSICAL EXAM: Gen: NAD, alert, cooperative with exam, well-appearing HEENT: clear conjunctiva, EOMI CV:  no edema, capillary refill brisk,  Resp: non-labored, normal speech Skin: no rashes, normal turgor  Neuro: no gross deficits.  Psych:  alert and oriented Back/hip: No tenderness to palpation over the lumbar spine. No tenderness palpation of the greater trochanter bilaterally. No tenderness to palpation of the SI joints bilaterally. No tenderness to palpation over the piriformis. No pain to palpation of the ischial tuberosity Normal hip flexion. Normal knee extension and  flexion. 5 out of 5 strength in lower external bilaterally. Negative straight-leg raise bilaterally. Weaker hip abduction bilaterally. Normal hip adduction. Negative Faber and Fadir test. FAIR test with some reproduction of pain on the right buttock. 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 Weakness noted on exam and having a limp with running and walking.  - provided home exercises  - advised to stop running if having a limp. Avoid exercises that are exacerbating her pain.  - advised to f/u in 4-6 weeks if no improvement and may need to consider formal PT

## 2016-09-03 DIAGNOSIS — S76019A Strain of muscle, fascia and tendon of unspecified hip, initial encounter: Secondary | ICD-10-CM | POA: Insufficient documentation

## 2016-09-03 NOTE — Assessment & Plan Note (Addendum)
Most likely she is suffering from glute med syndrome. Weakness noted on exam and having a limp with running and walking.  - provided home exercises  - advised to stop running if having a limp. Avoid exercises that are exacerbating her pain.  - advised to f/u in 4-6 weeks if no improvement and may need to consider formal PT

## 2016-10-14 ENCOUNTER — Ambulatory Visit (INDEPENDENT_AMBULATORY_CARE_PROVIDER_SITE_OTHER): Payer: Medicare Other | Admitting: Nurse Practitioner

## 2016-10-14 ENCOUNTER — Encounter: Payer: Self-pay | Admitting: Nurse Practitioner

## 2016-10-14 ENCOUNTER — Encounter: Payer: Self-pay | Admitting: *Deleted

## 2016-10-14 VITALS — BP 120/74 | HR 84 | Ht 67.25 in | Wt 138.0 lb

## 2016-10-14 DIAGNOSIS — E786 Lipoprotein deficiency: Secondary | ICD-10-CM

## 2016-10-14 DIAGNOSIS — Z01419 Encounter for gynecological examination (general) (routine) without abnormal findings: Secondary | ICD-10-CM | POA: Diagnosis not present

## 2016-10-14 DIAGNOSIS — Z1159 Encounter for screening for other viral diseases: Secondary | ICD-10-CM

## 2016-10-14 DIAGNOSIS — Z0000 Encounter for general adult medical examination without abnormal findings: Secondary | ICD-10-CM

## 2016-10-14 DIAGNOSIS — R8290 Unspecified abnormal findings in urine: Secondary | ICD-10-CM | POA: Diagnosis not present

## 2016-10-14 DIAGNOSIS — Z79899 Other long term (current) drug therapy: Secondary | ICD-10-CM | POA: Diagnosis not present

## 2016-10-14 DIAGNOSIS — E559 Vitamin D deficiency, unspecified: Secondary | ICD-10-CM

## 2016-10-14 LAB — TSH: TSH: 3.79 m[IU]/L

## 2016-10-14 LAB — LIPID PANEL
CHOL/HDL RATIO: 3 ratio (ref <=5.0)
Cholesterol: 190 mg/dL (ref 125–200)
HDL: 63 mg/dL (ref >=46)
LDL Cholesterol: 115 mg/dL (ref <130)
Triglycerides: 59 mg/dL (ref <150)
VLDL: 12 mg/dL (ref <30)

## 2016-10-14 LAB — POCT URINALYSIS DIPSTICK
Bilirubin, UA: NEGATIVE
Glucose, UA: NEGATIVE
KETONES UA: NEGATIVE
Nitrite, UA: NEGATIVE
PROTEIN UA: NEGATIVE
RBC UA: NEGATIVE
UROBILINOGEN UA: NEGATIVE
pH, UA: 7

## 2016-10-14 LAB — CBC
HCT: 42.6 % (ref 35.0–45.0)
Hemoglobin: 14.1 g/dL (ref 11.7–15.5)
MCH: 32.7 pg (ref 27.0–33.0)
MCHC: 33.1 g/dL (ref 32.0–36.0)
MCV: 98.8 fL (ref 80.0–100.0)
MPV: 9.1 fL (ref 7.5–12.5)
PLATELETS: 229 10*3/uL (ref 140–400)
RBC: 4.31 MIL/uL (ref 3.80–5.10)
RDW: 13.4 % (ref 11.0–15.0)
WBC: 4.1 10*3/uL (ref 3.8–10.8)

## 2016-10-14 LAB — HEPATITIS C ANTIBODY: HCV AB: NEGATIVE

## 2016-10-14 NOTE — Progress Notes (Signed)
Patient ID: Barbara Ayers, female   DOB: [birth year], 65 y.o.   MRN: [id number]  65 y.o. G74P2002 Divorced  Caucasian Fe here for annual exam.  No new health problems.  Her father was diagnosed with Alzheimer's at age 72, she is worried about herself and has found information about Vayacog.  Since we do not prescribe this she will see her PCP.  Patient's last menstrual period was 08/10/1998 (exact date).          Sexually active: No.  The current method of family planning is post menopausal status.    Exercising: Yes.    yoga, jogging Smoker:  no  Health Maintenance: Pap:10/09/15, Negative with neg HR HPV MMG: 01/30/15 3D with 3D Diagnostic Left MMG and Ultrasound;  Bi-Rads 1:  Negative, routine screen in one year Colonoscopy:10/09/14, benign polyp, repeat in 10 years BMD: 09/25/2013, 0.6 Spine / -0.3 Right Femur Neck / -0.3 Left Femur Neck TDaP:05/28/09 Shingles: 05/29/11 Pneumonia: 05/29/11 (pneumovax) Hep C: drawn today Labs: HB: 14.2   Urine: 2+ Leuk's   reports that she has never smoked. She has never used smokeless tobacco. She reports that she does not drink alcohol or use drugs.  Past Medical History:  Diagnosis Date  . RA (rheumatoid arthritis) (HCC)   . Transfusion history     Past Surgical History:  Procedure Laterality Date  . CARPAL TUNNEL RELEASE    . TENDON RELEASE Right 2006    Current Outpatient Prescriptions  Medication Sig Dispense Refill  . CALCIUM PO Take 1 tablet by mouth daily.    . Cholecalciferol (VITAMIN D) 2000 UNITS CAPS Take by mouth.    . cycloSPORINE (RESTASIS) 0.05 % ophthalmic emulsion 1 drop 2 (two) times daily.    . fish oil-omega-3 fatty acids 1000 MG capsule Take 2 g by mouth daily.    . folic acid (FOLVITE) 1 MG tablet Take 1 mg by mouth daily.    . methotrexate (RHEUMATREX) 2.5 MG tablet Take 10 mg by mouth once a week. Caution:Chemotherapy. Protect from light.     No current facility-administered medications for this visit.     Family  History  Problem Relation Age of Onset  . Breast cancer Maternal Grandmother 9  . Diabetes Other   . Heart disease Other   . Heart failure Mother   . Heart disease Mother   . Lung cancer Mother 53    smoker  . Heart disease Father   . Colon cancer Neg Hx   . Pancreatic cancer Neg Hx   . Stomach cancer Neg Hx   . Rectal cancer Neg Hx     ROS:  Pertinent items are noted in HPI.  Otherwise, a comprehensive ROS was negative.  Exam:   LMP 08/10/1998 (Exact Date)    Ht Readings from Last 3 Encounters:  09/02/16 5\' 7"  (1.702 m)  10/09/15 5' 7.25" (1.708 m)  10/04/14 5' 7.5" (1.715 m)    General appearance: alert, cooperative and appears stated age Head: Normocephalic, without obvious abnormality, atraumatic Neck: no adenopathy, supple, symmetrical, trachea midline and thyroid normal to inspection and palpation Lungs: clear to auscultation bilaterally Breasts: normal appearance, no masses or tenderness Heart: regular rate and rhythm Abdomen: soft, non-tender; no masses,  no organomegaly Extremities: extremities normal, atraumatic, no cyanosis or edema Skin: Skin color, texture, turgor normal. No rashes or lesions Lymph nodes: Cervical, supraclavicular, and axillary nodes normal. No abnormal inguinal nodes palpated Neurologic: Grossly normal   Pelvic: External genitalia:  no lesions  Urethra:  normal appearing urethra with no masses, tenderness or lesions              Bartholin's and Skene's: normal                 Vagina: normal appearing vagina with normal color and discharge, no lesions              Cervix: anteverted              Pap taken: No. Bimanual Exam:  Uterus:  normal size, contour, position, consistency, mobility, non-tender              Adnexa: no mass, fullness, tenderness               Rectovaginal: Confirms               Anus:  normal sphincter tone, no lesions  Chaperone present: no per request  A:  Well Woman with normal exam     Postmenopausal no HRT  History of RA - stable on med's   P:   Reviewed health and wellness pertinent to exam  Pap smear not done  Strongly recommend that she have another Mammogram soon.  Note faxed to [facility] for BMD as well - asked them to call her and schedule  Follow with labs  Counseled on breast self exam, mammography screening, adequate intake of calcium and vitamin D, diet and exercise return annually or prn  An After Visit Summary was printed and given to the patient.

## 2016-10-14 NOTE — Patient Instructions (Addendum)

## 2016-10-15 LAB — VITAMIN D 25 HYDROXY (VIT D DEFICIENCY, FRACTURES): VIT D 25 HYDROXY: 46 ng/mL (ref 30–100)

## 2016-10-15 LAB — URINE CULTURE

## 2016-10-16 LAB — HEMOGLOBIN, FINGERSTICK: Hemoglobin, fingerstick: 14.2 g/dL (ref 12.0–16.0)

## 2016-10-18 NOTE — Progress Notes (Signed)
Encounter reviewed by Dr. Brook Amundson C. Silva.  

## 2016-11-10 ENCOUNTER — Encounter: Payer: Self-pay | Admitting: Nurse Practitioner

## 2017-04-06 DIAGNOSIS — Z955 Presence of coronary angioplasty implant and graft: Secondary | ICD-10-CM | POA: Insufficient documentation

## 2017-04-27 ENCOUNTER — Telehealth (HOSPITAL_COMMUNITY): Payer: Self-pay | Admitting: Family Medicine

## 2017-04-27 NOTE — Telephone Encounter (Signed)
Verified [employer] Medicare insurance benefits through [employer]port Copay $20.00, No Coinsurance or Deductible Out of Pocket $4000.00, pt has met $274.56... Reference [id number]... KJ

## 2017-04-30 ENCOUNTER — Telehealth (HOSPITAL_COMMUNITY): Payer: Self-pay | Admitting: Family Medicine

## 2017-05-18 ENCOUNTER — Telehealth (HOSPITAL_COMMUNITY): Payer: Self-pay

## 2017-05-18 NOTE — Telephone Encounter (Signed)
Update: Verified [employer] insurance benefits through [employer] Copay $20.00, No Coinsurance or Deductible Out of Pocket $4000.00, pt has met $704.56 Reference [id number]... KJ

## 2017-05-20 ENCOUNTER — Encounter (HOSPITAL_COMMUNITY)
Admission: RE | Admit: 2017-05-20 | Discharge: 2017-05-20 | Disposition: A | Discharge status: Home / Self Care | Payer: Medicare Other | Source: Ambulatory Visit | Attending: Cardiology | Admitting: Cardiology

## 2017-05-20 VITALS — BP 98/66 | HR 89 | Ht 67.0 in | Wt 141.3 lb

## 2017-05-20 DIAGNOSIS — I25119 Atherosclerotic heart disease of native coronary artery with unspecified angina pectoris: Secondary | ICD-10-CM | POA: Diagnosis present

## 2017-05-20 DIAGNOSIS — Z955 Presence of coronary angioplasty implant and graft: Secondary | ICD-10-CM

## 2017-05-20 NOTE — Progress Notes (Signed)
Cardiac Individual Treatment Plan  Patient Details  Name: Barbara Ayers MRN: [id number] Date of Birth: December 15, 1951 Referring Provider:     CARDIAC REHAB PHASE II ORIENTATION from 05/20/2017 in [facility] CARDIAC REHAB  Referring Provider  Kem Boroughs MD      Initial Encounter Date:    CARDIAC REHAB PHASE II ORIENTATION from 05/20/2017 in [facility] CARDIAC REHAB  Date  05/20/17  Referring Provider  Kem Boroughs MD      Visit Diagnosis: 04/15/17 Status post coronary artery stent placement  Patient's Home Medications on Admission:  Current Outpatient Prescriptions:  .  aspirin EC 81 MG tablet, Take 81 mg by mouth daily., Disp: , Rfl:  .  atorvastatin (LIPITOR) 40 MG tablet, Take 40 mg by mouth daily at 6 PM., Disp: , Rfl:  .  CALCIUM PO, Take 1 tablet by mouth daily., Disp: , Rfl:  .  Cholecalciferol (VITAMIN D) 2000 UNITS CAPS, Take by mouth., Disp: , Rfl:  .  cyanocobalamin 1000 MCG tablet, Take 1,000 mcg by mouth daily., Disp: , Rfl:  .  FLUoxetine (PROZAC) 10 MG capsule, Take 10 mg by mouth daily., Disp: , Rfl:  .  folic acid (FOLVITE) 1 MG tablet, Take 1 mg by mouth daily., Disp: , Rfl:  .  methotrexate (RHEUMATREX) 2.5 MG tablet, Take 10 mg by mouth once a week. Caution:Chemotherapy. Protect from light., Disp: , Rfl:  .  Phosphatidylserine-DHA-EPA (VAYACOG PO), Take 1 capsule by mouth., Disp: , Rfl:  .  ticagrelor (BRILINTA) 90 MG TABS tablet, Take 90 mg by mouth 2 (two) times daily., Disp: , Rfl:  .  cycloSPORINE (RESTASIS) 0.05 % ophthalmic emulsion, 1 drop 2 (two) times daily., Disp: , Rfl:  .  fish oil-omega-3 fatty acids 1000 MG capsule, Take 2 g by mouth daily., Disp: , Rfl:   Past Medical History: Past Medical History:  Diagnosis Date  . RA (rheumatoid arthritis) (HCC)   . Transfusion history     Tobacco Use: History  Smoking Status  . Never Smoker  Smokeless Tobacco  . Never Used    Labs: Recent Review Flowsheet Data     Labs for ITP Cardiac and Pulmonary Rehab Latest Ref Rng & Units 09/25/2013 10/08/2014 10/09/2015 10/14/2016   Cholestrol 125 - 200 mg/dL [phone]   LDLCALC <130 mg/dL 91 98 89 240   HDL >=97 mg/dL 56 63 66 63   Trlycerides <150 mg/dL 82 93 81 59      Capillary Blood Glucose: No results found for: GLUCAP   Exercise Target Goals: Date: 05/20/17  Exercise Program Goal: Individual exercise prescription set with THRR, safety & activity barriers. Participant demonstrates ability to understand and report RPE using BORG scale, to self-measure pulse accurately, and to acknowledge the importance of the exercise prescription.  Exercise Prescription Goal: Starting with aerobic activity 30 plus minutes a day, 3 days per week for initial exercise prescription. Provide home exercise prescription and guidelines that participant acknowledges understanding prior to discharge.  Activity Barriers & Risk Stratification:   6 Minute Walk:     6 Minute Walk    Row Name 05/20/17 0957 05/20/17 1059       6 Minute Walk   Phase Initial  -    Distance 2200 feet  -    Walk Time 6 minutes  -    # of Rest Breaks 0  -    MPH 4.9  -    METS 4.2  -  RPE 9  -    VO2 Peak 17.2  -    Symptoms No  -    Resting HR 89 bpm  -    Resting BP 98/66  -    Max Ex. HR 108 bpm  -    Max Ex. BP 110/70  -    2 Minute Post BP  - 104/72       Oxygen Initial Assessment:   Oxygen Re-Evaluation:   Oxygen Discharge (Final Oxygen Re-Evaluation):   Initial Exercise Prescription:     Initial Exercise Prescription - 05/20/17 0900      Date of Initial Exercise RX and Referring Provider   Date 05/20/17   Referring Provider Kem Boroughs MD     Treadmill   MPH 3   Grade 2   Minutes 10   METs 4.12     Bike   Level 1   Minutes 10   METs 3.9     NuStep   Level 3   SPM 85   Minutes 10   METs 3     Prescription Details   Frequency (times per week) 3   Duration Progress to 45 minutes of  aerobic exercise without signs/symptoms of physical distress     Intensity   THRR 40-80% of Max Heartrate 62-123   Ratings of Perceived Exertion 11-13   Perceived Dyspnea 0-4     Progression   Progression Continue to progress workloads to maintain intensity without signs/symptoms of physical distress.     Resistance Training   Training Prescription Yes   Weight 2   Reps 10-15      Perform Capillary Blood Glucose checks as needed.  Exercise Prescription Changes:   Exercise Comments:   Exercise Goals and Review:     Exercise Goals    Row Name 05/20/17 0829             Exercise Goals   Increase Physical Activity Yes       Intervention Provide advice, education, support and counseling about physical activity/exercise needs.;Develop an individualized exercise prescription for aerobic and resistive training based on initial evaluation findings, risk stratification, comorbidities and participant's personal goals.       Expected Outcomes Achievement of increased cardiorespiratory fitness and enhanced flexibility, muscular endurance and strength shown through measurements of functional capacity and personal statement of participant.       Increase Strength and Stamina Yes       Intervention Provide advice, education, support and counseling about physical activity/exercise needs.;Develop an individualized exercise prescription for aerobic and resistive training based on initial evaluation findings, risk stratification, comorbidities and participant's personal goals.       Expected Outcomes Achievement of increased cardiorespiratory fitness and enhanced flexibility, muscular endurance and strength shown through measurements of functional capacity and personal statement of participant.          Exercise Goals Re-Evaluation :    Discharge Exercise Prescription (Final Exercise Prescription Changes):   Nutrition:  Target Goals: Understanding of nutrition guidelines, daily intake  of sodium 1500mg , cholesterol 200mg , calories 30% from fat and 7% or less from saturated fats, daily to have 5 or more servings of fruits and vegetables.  Biometrics:     Pre Biometrics - 05/20/17 1004      Pre Biometrics   Height 5\' 7"  (1.702 m)   Weight 141 lb 5 oz (64.1 kg)   Waist Circumference 28.75 inches   Hip Circumference 38.5 inches   Waist to Hip Ratio 0.75 %  BMI (Calculated) 22.2   Triceps Skinfold 26 mm   % Body Fat 33 %   Grip Strength 22 kg   Flexibility 13 in   Single Leg Stand 30 seconds       Nutrition Therapy Plan and Nutrition Goals:   Nutrition Discharge: Nutrition Scores:   Nutrition Goals Re-Evaluation:   Nutrition Goals Re-Evaluation:   Nutrition Goals Discharge (Final Nutrition Goals Re-Evaluation):   Psychosocial: Target Goals: Acknowledge presence or absence of significant depression and/or stress, maximize coping skills, provide positive support system. Participant is able to verbalize types and ability to use techniques and skills needed for reducing stress and depression.  Initial Review & Psychosocial Screening:     Initial Psych Review & Screening - 05/20/17 1708      Initial Review   Current issues with None Identified     Family Dynamics   Good Support System? Yes  sons, sisters, friends   Comments upon brief assessment, no psychosocial needs identified     Barriers   Psychosocial barriers to participate in program There are no identifiable barriers or psychosocial needs.     Screening Interventions   Interventions Encouraged to exercise;Provide feedback about the scores to participant      Quality of Life Scores:     Quality of Life - 05/20/17 1004      Quality of Life Scores   Health/Function Pre 29.14 %   Socioeconomic Pre 30 %   Psych/Spiritual Pre 30 %   Family Pre 30 %   GLOBAL Pre 29.63 %      PHQ-9: Recent Review Flowsheet Data    Depression screen [facility] 2/9 09/02/2016 09/02/2016   Decreased  Interest 0 0   Down, Depressed, Hopeless 0 0   PHQ - 2 Score 0 0     Interpretation of Total Score  Total Score Depression Severity:  1-4 = Minimal depression, 5-9 = Mild depression, 10-14 = Moderate depression, 15-19 = Moderately severe depression, 20-27 = Severe depression   Psychosocial Evaluation and Intervention:   Psychosocial Re-Evaluation:   Psychosocial Discharge (Final Psychosocial Re-Evaluation):   Vocational Rehabilitation: Provide vocational rehab assistance to qualifying candidates.   Vocational Rehab Evaluation & Intervention:     Vocational Rehab - 05/20/17 1706      Initial Vocational Rehab Evaluation & Intervention   Assessment shows need for Vocational Rehabilitation No      Education: Education Goals: Education classes will be provided on a weekly basis, covering required topics. Participant will state understanding/return demonstration of topics presented.  Learning Barriers/Preferences:     Learning Barriers/Preferences - 05/20/17 9379      Learning Barriers/Preferences   Learning Barriers Sight   Learning Preferences Written Material      Education Topics: Count Your Pulse:  -Group instruction provided by verbal instruction, demonstration, patient participation and written materials to support subject.  Instructors address importance of being able to find your pulse and how to count your pulse when at home without a heart monitor.  Patients get hands on experience counting their pulse with staff help and individually.   Heart Attack, Angina, and Risk Factor Modification:  -Group instruction provided by verbal instruction, video, and written materials to support subject.  Instructors address signs and symptoms of angina and heart attacks.    Also discuss risk factors for heart disease and how to make changes to improve heart health risk factors.   Functional Fitness:  -Group instruction provided by verbal instruction, demonstration, patient  participation, and written  materials to support subject.  Instructors address safety measures for doing things around the house.  Discuss how to get up and down off the floor, how to pick things up properly, how to safely get out of a chair without assistance, and balance training.   Meditation and Mindfulness:  -Group instruction provided by verbal instruction, patient participation, and written materials to support subject.  Instructor addresses importance of mindfulness and meditation practice to help reduce stress and improve awareness.  Instructor also leads participants through a meditation exercise.    Stretching for Flexibility and Mobility:  -Group instruction provided by verbal instruction, patient participation, and written materials to support subject.  Instructors lead participants through series of stretches that are designed to increase flexibility thus improving mobility.  These stretches are additional exercise for major muscle groups that are typically performed during regular warm up and cool down.   Hands Only CPR:  -Group verbal, video, and participation provides a basic overview of AHA guidelines for community CPR. Role-play of emergencies allow participants the opportunity to practice calling for help and chest compression technique with discussion of AED use.   Hypertension: -Group verbal and written instruction that provides a basic overview of hypertension including the most recent diagnostic guidelines, risk factor reduction with self-care instructions and medication management.    Nutrition I class: Heart Healthy Eating:  -Group instruction provided by PowerPoint slides, verbal discussion, and written materials to support subject matter. The instructor gives an explanation and review of the Therapeutic Lifestyle Changes diet recommendations, which includes a discussion on lipid goals, dietary fat, sodium, fiber, plant stanol/sterol esters, sugar, and the components of  a well-balanced, healthy diet.   Nutrition II class: Lifestyle Skills:  -Group instruction provided by PowerPoint slides, verbal discussion, and written materials to support subject matter. The instructor gives an explanation and review of label reading, grocery shopping for heart health, heart healthy recipe modifications, and ways to make healthier choices when eating out.   Diabetes Question & Answer:  -Group instruction provided by PowerPoint slides, verbal discussion, and written materials to support subject matter. The instructor gives an explanation and review of diabetes co-morbidities, pre- and post-prandial blood glucose goals, pre-exercise blood glucose goals, signs, symptoms, and treatment of hypoglycemia and hyperglycemia, and foot care basics.   Diabetes Blitz:  -Group instruction provided by PowerPoint slides, verbal discussion, and written materials to support subject matter. The instructor gives an explanation and review of the physiology behind type 1 and type 2 diabetes, diabetes medications and rational behind using different medications, pre- and post-prandial blood glucose recommendations and Hemoglobin A1c goals, diabetes diet, and exercise including blood glucose guidelines for exercising safely.    Portion Distortion:  -Group instruction provided by PowerPoint slides, verbal discussion, written materials, and food models to support subject matter. The instructor gives an explanation of serving size versus portion size, changes in portions sizes over the last 20 years, and what consists of a serving from each food group.   Stress Management:  -Group instruction provided by verbal instruction, video, and written materials to support subject matter.  Instructors review role of stress in heart disease and how to cope with stress positively.     Exercising on Your Own:  -Group instruction provided by verbal instruction, power point, and written materials to support  subject.  Instructors discuss benefits of exercise, components of exercise, frequency and intensity of exercise, and end points for exercise.  Also discuss use of nitroglycerin and activating EMS.  Review options  of places to exercise outside of rehab.  Review guidelines for sex with heart disease.   Cardiac Drugs I:  -Group instruction provided by verbal instruction and written materials to support subject.  Instructor reviews cardiac drug classes: antiplatelets, anticoagulants, beta blockers, and statins.  Instructor discusses reasons, side effects, and lifestyle considerations for each drug class.   Cardiac Drugs II:  -Group instruction provided by verbal instruction and written materials to support subject.  Instructor reviews cardiac drug classes: angiotensin converting enzyme inhibitors (ACE-I), angiotensin II receptor blockers (ARBs), nitrates, and calcium channel blockers.  Instructor discusses reasons, side effects, and lifestyle considerations for each drug class.   Anatomy and Physiology of the Circulatory System:  Group verbal and written instruction and models provide basic cardiac anatomy and physiology, with the coronary electrical and arterial systems. Review of: AMI, Angina, Valve disease, Heart Failure, Peripheral Artery Disease, Cardiac Arrhythmia, Pacemakers, and the ICD.   Other Education:  -Group or individual verbal, written, or video instructions that support the educational goals of the cardiac rehab program.   Knowledge Questionnaire Score:     Knowledge Questionnaire Score - 05/20/17 0957      Knowledge Questionnaire Score   Pre Score 22/24      Core Components/Risk Factors/Patient Goals at Admission:     Personal Goals and Risk Factors at Admission - 05/20/17 0829      Core Components/Risk Factors/Patient Goals on Admission   Personal Goal Other Yes   Personal Goal Know that stent is working. Get back to normal and be able to do activities without  discomfort, anxiety and SOB   Intervention Provide education on cardiac risk factors, medication management, stress manangement and exercie guidelines to improve cardiac knowledge, confidence and reduce risk factors/anxiety.   Expected Outcomes Pt will have increased knowledge on CV risk factors, reduce stress/anxiety and increased confidence with exercise/activity.      Core Components/Risk Factors/Patient Goals Review:    Core Components/Risk Factors/Patient Goals at Discharge (Final Review):    ITP Comments:     ITP Comments    Row Name 05/20/17 0825           ITP Comments Dr. Armanda Magic, Medical Director          Comments: Patient attended orientation from [phone] to 1018  to review rules and guidelines for program. Completed 6 minute walk test, Intitial ITP, and exercise prescription.  VSS. Telemetry-sinus rhythm,   Asymptomatic.

## 2017-05-20 NOTE — Progress Notes (Signed)
Cardiac Rehab Medication Review by a Pharmacist  Does the patient  feel that his/her medications are working for him/her?  yes  Has the patient been experiencing any side effects to the medications prescribed?  Yes, some dyspnea with brilinta.   Does the patient measure his/her own blood pressure or blood glucose at home?  no   Does the patient have any problems obtaining medications due to transportation or finances?   no  Understanding of regimen: good Understanding of indications: good Potential of compliance: good   Pharmacist comments: 64 yoW presents in good spirits. She had her stent placed at [facility] about 1 month ago and is currently only on DAPT, no beta-blocker. She does not check her blood pressure at home either. She does report some dyspnea, which she knows can occur with the brilinta, but it does not bother her so she will continue. I told her to let her cardiologist knows if it worsens or otherwise becomes a problem. We also discussed purchasing a blood pressure cuff for her to check her BP at home, to which she agreed. She is going to ask her cardiologist if a beta-blocker is indicated in her, but the EKG from care everywhere has her heart rate listed at 12, so she may not be a candidate.    Barbara Ayers, [name], PharmD Clinical Pharmacy Resident [phone] (Pager) 05/20/2017 9:03 AM

## 2017-05-20 NOTE — Progress Notes (Signed)
Pt in this morning for cardiac rehab orientation s/p DES to LAD at [facility].  Pt reported to rehab staff that she took a NTG last night due to back (in between shoulder blades) discomfort. This differed from the pain pt felt prior to her stent placement which was tightness to the center of her chest.  Her discomfort was not relieved with the NTG.  This symptom was reported to her cardiologist. Dr. Dani Gobble who recommended close follow up which pt pushed back for later in the month.  Called and spoke to Dr. Domingo Sep nurse, Dr. Domingo Sep on vacation this week.  Relayed pt symptoms to her nurse [place].  Katrina indicated she would check and give [place] a call back.  Received call back from Dr Domingo Sep. Relayed information to Dr. Domingo Sep.  Dr. Domingo Sep talked to pt and felt her discomfort was not cardiac in origin since there was no response from the ntg and it comes and go with no correlation to activity. Pt advised to follow up with her primary MD for possible back issue. Pt advised that she has Dr. Domingo Sep phone number and she should use it if she has any concerns.  Dr. Domingo Sep would like for pt to see her in the office in 3-4 weeks after starting Cardiac rehab. Pt verbalized understanding and is in agreement of this. Alanson Aly, BSN Cardiac and Emergency planning/management officer

## 2017-05-26 ENCOUNTER — Encounter (HOSPITAL_COMMUNITY): Payer: Self-pay

## 2017-05-26 ENCOUNTER — Encounter (HOSPITAL_COMMUNITY): Payer: Medicare Other

## 2017-05-26 ENCOUNTER — Ambulatory Visit (HOSPITAL_COMMUNITY): Payer: Medicare Other

## 2017-05-26 ENCOUNTER — Encounter (HOSPITAL_COMMUNITY)
Admission: RE | Admit: 2017-05-26 | Discharge: 2017-05-26 | Disposition: A | Discharge status: Home / Self Care | Payer: Medicare Other | Source: Ambulatory Visit | Attending: Cardiology | Admitting: Cardiology

## 2017-05-26 DIAGNOSIS — I25119 Atherosclerotic heart disease of native coronary artery with unspecified angina pectoris: Secondary | ICD-10-CM | POA: Diagnosis not present

## 2017-05-26 DIAGNOSIS — Z955 Presence of coronary angioplasty implant and graft: Secondary | ICD-10-CM

## 2017-05-26 NOTE — Progress Notes (Signed)
Daily Session Note  Patient Details  Name: Barbara Ayers MRN: [id number] Date of Birth: Jan 10, 1951 Referring Provider:     CARDIAC REHAB PHASE II ORIENTATION from 05/20/2017 in [place]  Referring Provider  Odette Fraction MD      Encounter Date: 05/26/2017  Check In:     Session Check In - 05/26/17 0714      Check-In   Location [facility]-Cardiac & Pulmonary Rehab   Staff Present Cleda Mccreedy, MS, Exercise Physiologist;Olinty Celesta Aver, MS, ACSM CEP, Exercise Physiologist;Joann Rion, RN, BSN   Supervising physician immediately available to respond to emergencies [facility] immediately available   Physician(s) Dr. Broadus John   Medication changes reported     No   Fall or balance concerns reported    No   Tobacco Cessation No Change   Warm-up and Cool-down Performed as group-led instruction   Resistance Training Performed No   VAD Patient? No     Pain Assessment   Currently in Pain? No/denies   Multiple Pain Sites No      Capillary Blood Glucose: No results found for this or any previous visit (from the past 24 hour(s)).    History  Smoking Status  . Never Smoker  Smokeless Tobacco  . Never Used    Goals Met:  Exercise tolerated well  Goals Unmet:  Not Applicable  Comments: Pt started cardiac rehab today.  Pt tolerated light exercise without difficulty. VSS, telemetry-sinus rhythm,  asymptomatic.  Medication list reconciled. Pt denies barriers to medicaiton compliance.  PSYCHOSOCIAL ASSESSMENT:  PHQ-0. Pt exhibits positive coping skills, hopeful outlook with supportive family. No psychosocial needs identified at this time, no psychosocial interventions necessary.    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  Pt goals for cardiac rehab are to decrease health related anxiety.   Pt oriented to exercise equipment and routine.    Understanding verbalized.   Dr. Fransico Him is  Medical Director for Cardiac Rehab at [facility].

## 2017-05-28 ENCOUNTER — Encounter (HOSPITAL_COMMUNITY): Payer: Medicare Other

## 2017-05-31 ENCOUNTER — Encounter (HOSPITAL_COMMUNITY): Payer: Medicare Other

## 2017-05-31 ENCOUNTER — Encounter (HOSPITAL_COMMUNITY)
Admission: RE | Admit: 2017-05-31 | Discharge: 2017-05-31 | Disposition: A | Discharge status: Home / Self Care | Payer: Medicare Other | Source: Ambulatory Visit | Attending: Cardiology | Admitting: Cardiology

## 2017-05-31 DIAGNOSIS — Z955 Presence of coronary angioplasty implant and graft: Secondary | ICD-10-CM | POA: Diagnosis not present

## 2017-05-31 DIAGNOSIS — I25119 Atherosclerotic heart disease of native coronary artery with unspecified angina pectoris: Secondary | ICD-10-CM | POA: Insufficient documentation

## 2017-06-02 ENCOUNTER — Encounter (HOSPITAL_COMMUNITY): Payer: Medicare Other

## 2017-06-02 ENCOUNTER — Encounter (HOSPITAL_COMMUNITY)
Admission: RE | Admit: 2017-06-02 | Discharge: 2017-06-02 | Disposition: A | Discharge status: Home / Self Care | Payer: Medicare Other | Source: Ambulatory Visit | Attending: Cardiology | Admitting: Cardiology

## 2017-06-02 VITALS — Wt 138.4 lb

## 2017-06-02 DIAGNOSIS — I25119 Atherosclerotic heart disease of native coronary artery with unspecified angina pectoris: Secondary | ICD-10-CM | POA: Diagnosis not present

## 2017-06-02 DIAGNOSIS — Z955 Presence of coronary angioplasty implant and graft: Secondary | ICD-10-CM

## 2017-06-04 ENCOUNTER — Encounter (HOSPITAL_COMMUNITY): Payer: Medicare Other

## 2017-06-07 ENCOUNTER — Encounter (HOSPITAL_COMMUNITY): Payer: Medicare Other

## 2017-06-09 ENCOUNTER — Encounter (HOSPITAL_COMMUNITY)
Admission: RE | Admit: 2017-06-09 | Discharge: 2017-06-09 | Disposition: A | Discharge status: Home / Self Care | Payer: Medicare Other | Source: Ambulatory Visit | Attending: Cardiology | Admitting: Cardiology

## 2017-06-09 ENCOUNTER — Encounter (HOSPITAL_COMMUNITY): Payer: Medicare Other

## 2017-06-11 ENCOUNTER — Encounter (HOSPITAL_COMMUNITY): Payer: Medicare Other

## 2017-06-14 ENCOUNTER — Encounter (HOSPITAL_COMMUNITY): Payer: Medicare Other

## 2017-06-14 ENCOUNTER — Telehealth (HOSPITAL_COMMUNITY): Payer: Self-pay | Admitting: Family Medicine

## 2017-06-15 ENCOUNTER — Telehealth (HOSPITAL_COMMUNITY): Payer: Self-pay | Admitting: Cardiac Rehabilitation

## 2017-06-15 NOTE — Telephone Encounter (Signed)
pc received from pt she is withdrawing from cardiac rehab program. Pt plans to exercise on her own. Pt cardiologist approved her to resume her usual activities.

## 2017-06-15 NOTE — Progress Notes (Signed)
Discharge Summary  Patient Details  Name: Barbara Ayers MRN: [id number] Date of Birth: [birth year] Referring Provider:     CARDIAC REHAB PHASE II ORIENTATION from 05/20/2017 in [facility] CARDIAC [facility]  Referring Provider  Kem Boroughs MD       Number of Visits: 4   Reason for Discharge:  Patient reached a stable level of exercise. Patient independent in their exercise.  Patient cardiologist approved for pt to resume usual activities.    Smoking History:  History  Smoking Status  . Never Smoker  Smokeless Tobacco  . Never Used    Diagnosis:  04/15/17 Status post coronary artery stent placement  ADL UCSD:   Initial Exercise Prescription:     Initial Exercise Prescription - 05/20/17 0900      Date of Initial Exercise RX and Referring Provider   Date 05/20/17   Referring Provider Kem Boroughs MD     Treadmill   MPH 3   Grade 2   Minutes 10   METs 4.12     Bike   Level 1   Minutes 10   METs 3.9     NuStep   Level 3   SPM 85   Minutes 10   METs 3     Prescription Details   Frequency (times per week) 3   Duration Progress to 45 minutes of aerobic exercise without signs/symptoms of physical distress     Intensity   THRR 40-80% of Max Heartrate 62-123   Ratings of Perceived Exertion 11-13   Perceived Dyspnea 0-4     Progression   Progression Continue to progress workloads to maintain intensity without signs/symptoms of physical distress.     Resistance Training   Training Prescription Yes   Weight 2   Reps 10-15      Discharge Exercise Prescription (Final Exercise Prescription Changes):     Exercise Prescription Changes - 06/07/17 1600      Response to Exercise   Blood Pressure (Admit) 100/70   Blood Pressure (Exercise) 120/70   Blood Pressure (Exit) 100/60   Heart Rate (Admit) 84 bpm   Heart Rate (Exercise) 122 bpm   Heart Rate (Exit) 93 bpm   Rating of Perceived Exertion (Exercise) 12   Duration Progress to 45  minutes of aerobic exercise without signs/symptoms of physical distress   Intensity THRR unchanged     Progression   Progression Continue to progress workloads to maintain intensity without signs/symptoms of physical distress.   Average METs 4     Resistance Training   Training Prescription Yes   Weight 3lb   Reps 10-15     Treadmill   MPH 3.5   Grade 2   Minutes 10   METs 4.65     Bike   Level 1   Minutes 10   METs 4     NuStep   Level 4   SPM 85   Minutes 10   METs 3.4      Functional Capacity:     6 Minute Walk    Row Name 05/20/17 0957 05/20/17 1059       6 Minute Walk   Phase Initial  -    Distance 2200 feet  -    Walk Time 6 minutes  -    # of Rest Breaks 0  -    MPH 4.9  -    METS 4.2  -    RPE 9  -    VO2 Peak 17.2  -  Symptoms No  -    Resting HR 89 bpm  -    Resting BP 98/66  -    Max Ex. HR 108 bpm  -    Max Ex. BP 110/70  -    2 Minute Post BP  - 104/72       Psychological, QOL, Others - Outcomes: PHQ 2/9: Depression screen [facility] 2/9 05/26/2017 09/02/2016 09/02/2016  Decreased Interest 0 0 0  Down, Depressed, Hopeless 0 0 0  PHQ - 2 Score 0 0 0    Quality of Life:     Quality of Life - 05/20/17 1004      Quality of Life Scores   Health/Function Pre 29.14 %   Socioeconomic Pre 30 %   Psych/Spiritual Pre 30 %   Family Pre 30 %   GLOBAL Pre 29.63 %      Personal Goals: Goals established at orientation with interventions provided to work toward goal.     Personal Goals and Risk Factors at Admission - 06/02/17 1030      Core Components/Risk Factors/Patient Goals on Admission   Expected Outcomes Pt will have increased knowledge on CV risk factors, reduce stress/anxiety and increased confidence with exercise/activity.       Personal Goals Discharge:     Goals and Risk Factor Review    Row Name 06/02/17 1029 06/02/17 1030           Core Components/Risk Factors/Patient Goals Review   Personal Goals Review Other  -       Review  - pt is exercising on her own at home. pt has continued DOE with stair climbing. pt encouraged to discuss with cardiologist at scheduled appt.        Expected Outcomes  - Pt will have increased knowledge on CV risk factors, reduce stress/anxiety and increased confidence with exercise/activity.         Nutrition & Weight - Outcomes:     Pre Biometrics - 05/20/17 1004      Pre Biometrics   Height 5\' 7"  (1.702 m)   Weight 141 lb 5 oz (64.1 kg)   Waist Circumference 28.75 inches   Hip Circumference 38.5 inches   Waist to Hip Ratio 0.75 %   BMI (Calculated) 22.2   Triceps Skinfold 26 mm   % Body Fat 33 %   Grip Strength 22 kg   Flexibility 13 in   Single Leg Stand 30 seconds       Nutrition:   Nutrition Discharge:   Education Questionnaire Score:     Knowledge Questionnaire Score - 05/20/17 0957      Knowledge Questionnaire Score   Pre Score 22/24      Goals reviewed with patient; copy given to patient.

## 2017-06-16 ENCOUNTER — Encounter (HOSPITAL_COMMUNITY): Payer: Medicare Other

## 2017-06-18 ENCOUNTER — Encounter (HOSPITAL_COMMUNITY): Payer: Medicare Other

## 2017-06-21 ENCOUNTER — Encounter (HOSPITAL_COMMUNITY): Payer: Medicare Other

## 2017-06-23 ENCOUNTER — Encounter (HOSPITAL_COMMUNITY): Payer: Medicare Other

## 2017-06-25 ENCOUNTER — Encounter (HOSPITAL_COMMUNITY): Payer: Medicare Other

## 2017-06-28 ENCOUNTER — Ambulatory Visit (HOSPITAL_COMMUNITY): Payer: Medicare Other

## 2017-06-28 ENCOUNTER — Encounter (HOSPITAL_COMMUNITY): Payer: Medicare Other

## 2017-07-02 ENCOUNTER — Encounter (HOSPITAL_COMMUNITY): Payer: Medicare Other

## 2017-07-05 ENCOUNTER — Ambulatory Visit (HOSPITAL_COMMUNITY): Payer: Medicare Other

## 2017-07-05 ENCOUNTER — Encounter (HOSPITAL_COMMUNITY): Payer: Medicare Other

## 2017-07-07 ENCOUNTER — Ambulatory Visit (HOSPITAL_COMMUNITY): Payer: Medicare Other

## 2017-07-07 ENCOUNTER — Encounter (HOSPITAL_COMMUNITY): Payer: Medicare Other

## 2017-07-07 NOTE — Addendum Note (Signed)
Encounter addended by: Robyne Peers, RN on: 07/07/2017  2:45 PM<BR>    Actions taken: Episode resolved

## 2017-07-09 ENCOUNTER — Encounter (HOSPITAL_COMMUNITY): Payer: Medicare Other

## 2017-07-12 ENCOUNTER — Encounter (HOSPITAL_COMMUNITY): Payer: Medicare Other

## 2017-07-12 ENCOUNTER — Ambulatory Visit (HOSPITAL_COMMUNITY): Payer: Medicare Other

## 2017-07-14 ENCOUNTER — Encounter (HOSPITAL_COMMUNITY): Payer: Medicare Other

## 2017-07-14 ENCOUNTER — Ambulatory Visit (HOSPITAL_COMMUNITY): Payer: Medicare Other

## 2017-07-16 ENCOUNTER — Encounter (HOSPITAL_COMMUNITY): Payer: Medicare Other

## 2017-07-19 ENCOUNTER — Ambulatory Visit (HOSPITAL_COMMUNITY): Payer: Medicare Other

## 2017-07-19 ENCOUNTER — Encounter (HOSPITAL_COMMUNITY): Payer: Medicare Other

## 2017-07-19 ENCOUNTER — Telehealth: Payer: Self-pay | Admitting: Cardiovascular Disease

## 2017-07-19 NOTE — Telephone Encounter (Signed)
Received records from [facility] for appointment on 09/14/17 with Dr Tresa Endo.  Records put with Dr Landry Dyke schedule for 09/14/17. lp

## 2017-07-21 ENCOUNTER — Encounter (HOSPITAL_COMMUNITY): Payer: Medicare Other

## 2017-07-21 ENCOUNTER — Ambulatory Visit (HOSPITAL_COMMUNITY): Payer: Medicare Other

## 2017-07-23 ENCOUNTER — Encounter (HOSPITAL_COMMUNITY): Payer: Medicare Other

## 2017-07-26 ENCOUNTER — Ambulatory Visit (HOSPITAL_COMMUNITY): Payer: Medicare Other

## 2017-07-26 ENCOUNTER — Encounter (HOSPITAL_COMMUNITY): Payer: Medicare Other

## 2017-07-28 ENCOUNTER — Encounter (HOSPITAL_COMMUNITY): Payer: Medicare Other

## 2017-07-28 ENCOUNTER — Ambulatory Visit (HOSPITAL_COMMUNITY): Payer: Medicare Other

## 2017-07-30 ENCOUNTER — Encounter (HOSPITAL_COMMUNITY): Payer: Medicare Other

## 2017-08-02 ENCOUNTER — Ambulatory Visit (HOSPITAL_COMMUNITY): Payer: Medicare Other

## 2017-08-02 ENCOUNTER — Encounter (HOSPITAL_COMMUNITY): Payer: Medicare Other

## 2017-08-04 ENCOUNTER — Encounter (HOSPITAL_COMMUNITY): Payer: Medicare Other

## 2017-08-06 ENCOUNTER — Encounter (HOSPITAL_COMMUNITY): Payer: Medicare Other

## 2017-08-09 ENCOUNTER — Encounter (HOSPITAL_COMMUNITY): Payer: Medicare Other

## 2017-08-11 ENCOUNTER — Encounter (HOSPITAL_COMMUNITY): Payer: Medicare Other

## 2017-08-12 ENCOUNTER — Ambulatory Visit (INDEPENDENT_AMBULATORY_CARE_PROVIDER_SITE_OTHER): Payer: Medicare Other | Admitting: Sports Medicine

## 2017-08-12 DIAGNOSIS — M79672 Pain in left foot: Principal | ICD-10-CM

## 2017-08-12 DIAGNOSIS — M79671 Pain in right foot: Secondary | ICD-10-CM

## 2017-08-12 NOTE — Assessment & Plan Note (Signed)
This is likely related to RA and to activity  To keep her active we need her to stay in custom orthotics  Patient was fitted for a : standard, cushioned, semi-rigid orthotic. The orthotic was heated and afterward the patient stood on the orthotic blank positioned on the orthotic stand. The patient was positioned in subtalar neutral position and 10 degrees of ankle dorsiflexion in a weight bearing stance. After completion of molding, a stable base was applied to the orthotic blank. The blank was ground to a stable position for weight bearing. Size: 10 REd EVA Base: Medium density blue EVA Posting: med MT pads bilat Additional orthotic padding: none  Post gait - looked neutral Very good comfort  I spent 32 minutes with this patient. Over 50% of visit was spend in counseling and coordination of care for problems with foot pain and relationship to RA. Use of orthotics and modifications needed.

## 2017-08-12 NOTE — Progress Notes (Signed)
CC: forefoot pain chronic  Excellent relief of pain with use of custom orthotics Last pair about 6 yrs ago Trying to walk and even jog some daily Foot pain returning and came for reck  Glut med pain is resolving on RT with HEP Hamstring pain has resolved as well  Past hx RA - well controlled x 21 yrs/ now on MTX  ROS No foot swelling No numbness in feet  PE Thin F in NAD BP 112/70   Ht 5\' 7"  (1.702 m)   Wt 137 lb (62.1 kg)   LMP 08/10/1998 (Exact Date)   BMI 21.46 kg/m   Moderate high longitudinal arch preserved TMT bossing on left proximal arch Forefoot with some widening Good motion of great toe  No TTP over MTs today  Gait is heel strike to forefoot varus

## 2017-08-13 ENCOUNTER — Encounter (HOSPITAL_COMMUNITY): Payer: Medicare Other

## 2017-08-16 ENCOUNTER — Encounter (HOSPITAL_COMMUNITY): Payer: Medicare Other

## 2017-08-18 ENCOUNTER — Encounter (HOSPITAL_COMMUNITY): Payer: Medicare Other

## 2017-08-20 ENCOUNTER — Encounter (HOSPITAL_COMMUNITY): Payer: Medicare Other

## 2017-08-23 ENCOUNTER — Encounter (HOSPITAL_COMMUNITY): Payer: Medicare Other

## 2017-08-25 ENCOUNTER — Encounter (HOSPITAL_COMMUNITY): Payer: Medicare Other

## 2017-08-27 ENCOUNTER — Encounter (HOSPITAL_COMMUNITY): Payer: Medicare Other

## 2017-09-13 ENCOUNTER — Ambulatory Visit: Payer: Medicare Other | Admitting: Physical Therapy

## 2017-09-14 ENCOUNTER — Encounter: Payer: Self-pay | Admitting: Cardiovascular Disease

## 2017-09-14 ENCOUNTER — Ambulatory Visit (INDEPENDENT_AMBULATORY_CARE_PROVIDER_SITE_OTHER): Payer: Medicare Other | Admitting: Cardiovascular Disease

## 2017-09-14 VITALS — BP 102/72 | HR 72 | Ht 67.0 in | Wt 138.0 lb

## 2017-09-14 DIAGNOSIS — E785 Hyperlipidemia, unspecified: Secondary | ICD-10-CM | POA: Diagnosis not present

## 2017-09-14 DIAGNOSIS — Z9861 Coronary angioplasty status: Secondary | ICD-10-CM | POA: Diagnosis not present

## 2017-09-14 DIAGNOSIS — Z79899 Other long term (current) drug therapy: Secondary | ICD-10-CM | POA: Diagnosis not present

## 2017-09-14 DIAGNOSIS — I2510 Atherosclerotic heart disease of native coronary artery without angina pectoris: Secondary | ICD-10-CM | POA: Diagnosis not present

## 2017-09-14 DIAGNOSIS — M069 Rheumatoid arthritis, unspecified: Secondary | ICD-10-CM | POA: Diagnosis not present

## 2017-09-14 NOTE — Patient Instructions (Signed)
Medication Instructions:  Your physician recommends that you continue on your current medications as directed. Please refer to the Current Medication list given to you today.  Labwork: Please return for FASTING labwork (CBC, CMET, Lipid, TSH).  You can have this completed at our in office lab (M-F 8:30-4:30, closed for lunch 12:30-2, no appointment needed)  Follow-Up: Your physician wants you to follow-up in: 6 MONTHS with Dr. Tresa Endo.  You will receive a reminder letter in the mail two months in advance. If you don't receive a letter, please call our office to schedule the follow-up appointment.   Any Other Special Instructions Will Be Listed Below (If Applicable).     If you need a refill on your cardiac medications before your next appointment, please call your pharmacy.

## 2017-09-14 NOTE — Progress Notes (Signed)
Cardiology Office Note    Date:  09/16/2017   ID:  Adore, Kithcart 1951-06-26, MRN [id number]  PCP:  Darcus Austin, MD  Cardiologist:  Shelva Majestic, MD   New cardiology evaluation: Referred by [name] at [facility]  History of Present Illness:  Barbara Ayers is a 66 y.o. female who presents to the office through the referral of [name] to establish cardiology care.  Ms. Linam is an active 66 year old female who has a history of rheumatoid arthritis for approximately 20 years.  She has routinely exercised.  Earlier this year she began to notice exertional symptoms of vague chest tightness when running.  She is a personal friend of [name] ultimately was evaluated by her personal health cardiology in [place], [place].  She underwent an echo Doppler study which showed an EF of 55% with normal diastolic function.  There was trace to mild aortic insufficiency, mild MR and TR.  She was referred for a nuclear stress test which revealed equivocal findings for anteroseptal ischemia, although she did experience chest pain and had ischemic ECG abnormalities and probable rate related bundle branch block and a brief episode of nonsustained VT.  Her chest pain required approximately 55 minutes for complete resolution in recovery.  She  was referred to Dr. [name] at [facility] in cardiac catheterization on 04/15/2017 showed a 99% ostial stenosis of her LAD which was successfully stented with a 3.012 mm Resolute onyx DES stent postdilated to 3.25 mm.  She was also found to have mild 20% ostial left main narrowing, 40% mid LAD stenosis and 20% mid RCA stenosis.  Following her intervention, she has been pain-free.  She is back exercising and denies any exertional symptomatology.  She has seen Dr. Mathis Bud in follow-up.  Since the patient lives in [place] she is now referred for continued cardiology care.   Presently, she feels well.  She  denies recurrent chest pain.  She denies palpitations.  She denies presyncope or syncope.  She is followed by Dr. Inda Merlin, for primary care in [place].   Past Medical History:  Diagnosis Date  . Chest pain   . RA (rheumatoid arthritis) ([place])   . Transfusion history     Past Surgical History:  Procedure Laterality Date  . CARPAL TUNNEL RELEASE    . CORONARY ANGIOPLASTY    . TENDON RELEASE Right 2006    Current Medications: Outpatient Medications Prior to Visit  Medication Sig Dispense Refill  . aspirin EC 81 MG tablet Take 81 mg by mouth daily.    Marland Kitchen atorvastatin (LIPITOR) 40 MG tablet Take 40 mg by mouth daily at 6 PM.    . CALCIUM PO Take 600 mg by mouth daily.     . Cholecalciferol (VITAMIN D) 2000 UNITS CAPS Take by mouth.    . cyanocobalamin 1000 MCG tablet Take 1,000 mcg by mouth daily.    . fish oil-omega-3 fatty acids 1000 MG capsule Take 2 g by mouth daily.    . folic acid (FOLVITE) 1 MG tablet Take 1 mg by mouth daily.    . methotrexate (RHEUMATREX) 2.5 MG tablet Take 10 mg by mouth once a week. Caution:Chemotherapy. Protect from light.    . nitroGLYCERIN (NITROSTAT) 0.4 MG SL tablet Place 0.4 mg under the tongue every 5 (five) minutes as needed for chest pain.    . Phosphatidylserine-DHA-EPA (VAYACOG PO) Take 1 capsule by mouth.    . ticagrelor (BRILINTA) 90  MG TABS tablet Take 90 mg by mouth 2 (two) times daily.    . cycloSPORINE (RESTASIS) 0.05 % ophthalmic emulsion 1 drop 2 (two) times daily.    Marland Kitchen FLUoxetine (PROZAC) 10 MG capsule Take 10 mg by mouth daily.     No facility-administered medications prior to visit.      Allergies:   Amoxicillin   Social History   Social History  . Marital status: Divorced    Spouse name: N/A  . Number of children: N/A  . Years of education: N/A   Social History Main Topics  . Smoking status: Never Smoker  . Smokeless tobacco: Never Used  . Alcohol use No  . Drug use: No  . Sexual activity: No   Other Topics Concern  .  None   Social History Narrative  . None    Additional social history is notable in that she is divorced for 8 years.  She has 2 children and 2 grandchildren.  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  There is no history of tobacco use or alcohol use.  She exercises at least 5 days per week.  Family History:  The patient's family history includes Breast cancer (age of onset: 5) in her maternal grandmother; Diabetes in her other; Heart disease in her father, mother, and other; Heart failure in her mother; Lung cancer (age of onset: 55) in her mother.  Her father had suffered an initial MI at age 49. She has 2 sisters who are ages 58 and 28.  ROS General: Negative; No fevers, chills, or night sweats;  HEENT: Negative; No changes in vision or hearing, sinus congestion, difficulty swallowing Pulmonary: Negative; No cough, wheezing, shortness of breath, hemoptysis Cardiovascular: See history of present illness GI: Negative; No nausea, vomiting, diarrhea, or abdominal pain GU: Negative; No dysuria, hematuria, or difficulty voiding Musculoskeletal: Positive for rheumatoid arthritis Hematologic/Oncology: Negative; no easy bruising, bleeding Endocrine: Negative; no heat/cold intolerance; no diabetes Neuro: Negative; no changes in balance, headaches Skin: Negative; No rashes or skin lesions Psychiatric: Negative; No behavioral problems, depression Sleep: Negative; No snoring, daytime sleepiness, hypersomnolence, bruxism, restless legs, hypnogognic hallucinations, no cataplexy Other comprehensive 14 point system review is negative.   PHYSICAL EXAM:   VS:  BP 102/72   Pulse 72   Ht _0  (1.702 m)   Wt 138 lb (62.6 kg)   LMP 08/10/1998 (Exact Date)   BMI 21.61 kg/m     Wt Readings from Last 3 Encounters:  09/14/17 138 lb (62.6 kg)  08/12/17 137 lb (62.1 kg)  06/24/17 138 lb 7.2 oz (62.8 kg)    General: Alert, oriented, no distress.  Skin: normal turgor, no  rashes, warm and dry HEENT: Normocephalic, atraumatic. Pupils equal round and reactive to light; sclera anicteric; extraocular muscles intact; Fundi Normal without hemorrhages or exudates.  Disc flat Nose without nasal septal hypertrophy Mouth/Parynx benign; Mallinpatti scale 2 Neck: No JVD, no carotid bruits; normal carotid upstroke Lungs: clear to ausculatation and percussion; no wheezing or rales Chest wall: without tenderness to palpitation Heart: PMI not displaced, RRR, s1 s2 normal, intermittent systolic clicks with faint 1/6 systolic murmur, no appreciable diastolic murmur, no rubs, gallops, thrills, or heaves Abdomen: soft, nontender; no hepatosplenomehaly, BS+; abdominal aorta nontender and not dilated by palpation. Back: no CVA tenderness Pulses 2+ Musculoskeletal: full range of motion, normal strength, no joint deformities Extremities: no clubbing cyanosis or edema, Homan's sign negative  Neurologic: grossly nonfocal; Cranial nerves grossly wnl Psychologic:  Normal mood and affect   Studies/Labs Reviewed:   EKG:  EKG is ordered today. ECG (independently read by me): normal sinus rhythm at 72 bpm.  Normal intervals.  Mild RV conduction delay.  No significant ST T abnormalities.  Recent Labs: BMP Latest Ref Rng & Units 09/16/2017 10/08/2014  Glucose 65 - 99 mg/dL 81 82  BUN 8 - 27 mg/dL 18 19  Creatinine 0.57 - 1.00 mg/dL 0.82 0.89  BUN/Creat Ratio 12 - 28 22 -  Sodium 134 - 144 mmol/L 140 141  Potassium 3.5 - 5.2 mmol/L 4.3 4.8  Chloride 96 - 106 mmol/L 101 103  CO2 20 - 29 mmol/L 24 27  Calcium 8.7 - 10.3 mg/dL 9.2 9.7     Hepatic Function Latest Ref Rng & Units 09/16/2017  Total Protein 6.0 - 8.5 g/dL 7.3  Albumin 3.6 - 4.8 g/dL 4.4  AST 0 - 40 IU/L 28  ALT 0 - 32 IU/L 22  Alk Phosphatase 39 - 117 IU/L 88  Total Bilirubin 0.0 - 1.2 mg/dL 0.7    CBC Latest Ref Rng & Units 09/16/2017 10/14/2016 10/14/2016  WBC 3.4 - 10.8 x10E3/uL 4.3 4.1 -  Hemoglobin 11.1 - 15.9  g/dL 13.1 14.1 14.2  Hematocrit 34.0 - 46.6 % 39.0 42.6 -  Platelets 150 - 379 x10E3/uL 239 229 -   Lab Results  Component Value Date   MCV 95 09/16/2017   MCV 98.8 10/14/2016   Lab Results  Component Value Date   TSH WILL FOLLOW 09/16/2017   No results found for: HGBA1C   BNP No results found for: BNP  ProBNP No results found for: PROBNP   Lipid Panel     Component Value Date/Time   CHOL 126 09/16/2017 0828   TRIG 65 09/16/2017 0828   HDL 60 09/16/2017 0828   CHOLHDL 2.1 09/16/2017 0828   CHOLHDL 3.0 10/14/2016 1410   VLDL 12 10/14/2016 1410   LDLCALC 53 09/16/2017 0828     RADIOLOGY: No results found.   Additional studies/ records that were reviewed today include:  I reviewed the records from Dr. Mathis Bud at [facility] [place].  I reviewed the patient's noninvasive studies and cardiac catheterization and PCI records.   ASSESSMENT:    1. CAD S/P percutaneous coronary angioplasty   2. CAD in native artery   3. Hyperlipidemia with target LDL less than 70   4. Medication management   5. Rheumatoid arthritis, involving unspecified site, unspecified rheumatoid factor presence [facility])      PLAN:  Ms Janis Sol is a very pleasant 66 year old female who has a greater than 20 year history of rheumatoid arthritis and earlier this year had developed new onset exertional chest pressure which occurred while running.  She was found to have a 99% ostial LAD stenosis as the culprit lesion contributing to her symptomatology and underwent successful DES stenting with insertion of a Resolute onyx 3.012 mm DES stent on 04/15/2017.  Subsequent, she has been without recurrent anginal symptomatology.  She's resumed physical activity.  He denies exertional symptoms.  Of note, on her stress test.  She apparently had a short burst of nonsustained VT.  She denies any episodes of palpitations.  Presently, she is on dual antiplatelet therapy with aspirin and Brilinta and  denies bleeding.  She was questioning whether or not she could have her dose of atorvastatin reduced.  Currently she is on 40 mg daily with target LDL less than 70.  She has normal systolic function.  Her physical exam suggests the possibility of mitral valve prolapse but this was not apparently demonstrated on her previous 2-D echo Doppler study.  I have recommended she continue with high potency statin therapy rather than dose reduction. I discussed the importance of aggressive lipid-lowering therapy in attempt to induce plaque stability and regression of her concomitant CAD  I am recommending repeat laboratory be obtained in the fasting state consisting of a comprehensive chemistry profile, CBC, TSH, and lipid studies.  Adjustments to her medical regimen will be made if necessary.  Her blood pressure is low at 102/72, but stable.  Her ECG today reveals sinus rhythm at 72 bpm.  She will continue her current regimen.  In the future she may benefit from low-dose beta blocker therapy, but with her low blood pressure I will not start this presently.   I will see her in 6 months for cardiology evaluation.   Medication Adjustments/Labs and Tests Ordered: Current medicines are reviewed at length with the patient today.  Concerns regarding medicines are outlined above.  Medication changes, Labs and Tests ordered today are listed in the Patient Instructions below.  Patient Instructions  Medication Instructions:  Your physician recommends that you continue on your current medications as directed. Please refer to the Current Medication list given to you today.  Labwork: Please return for FASTING labwork (CBC, CMET, Lipid, TSH).  You can have this completed at our in office lab (M-F 8:30-4:30, closed for lunch 12:30-2, no appointment needed)  Follow-Up: Your physician wants you to follow-up in: 6 MONTHS with Dr. Claiborne Billings.  You will receive a reminder letter in the mail two months in advance. If you don't receive a  letter, please call our office to schedule the follow-up appointment.   Any Other Special Instructions Will Be Listed Below (If Applicable).     If you need a refill on your cardiac medications before your next appointment, please call your pharmacy.      Signed, Shelva Majestic, MD, [facility]  09/16/2017 8:51 PM    [facility] [address] [place], [place], [place]  [address] Phone: [phone]

## 2017-09-17 LAB — COMPREHENSIVE METABOLIC PANEL
A/G RATIO: 1.5 (ref 1.2–2.2)
ALBUMIN: 4.4 g/dL (ref 3.6–4.8)
ALK PHOS: 88 IU/L (ref 39–117)
ALT: 22 IU/L (ref 0–32)
AST: 28 IU/L (ref 0–40)
BUN / CREAT RATIO: 22 (ref 12–28)
BUN: 18 mg/dL (ref 8–27)
Bilirubin Total: 0.7 mg/dL (ref 0.0–1.2)
CO2: 24 mmol/L (ref 20–29)
Calcium: 9.2 mg/dL (ref 8.7–10.3)
Chloride: 101 mmol/L (ref 96–106)
Creatinine, Ser: 0.82 mg/dL (ref 0.57–1.00)
GFR calc Af Amer: 86 mL/min/{1.73_m2} (ref >59)
GFR calc non Af Amer: 75 mL/min/{1.73_m2} (ref >59)
GLOBULIN, TOTAL: 2.9 g/dL (ref 1.5–4.5)
Glucose: 81 mg/dL (ref 65–99)
POTASSIUM: 4.3 mmol/L (ref 3.5–5.2)
SODIUM: 140 mmol/L (ref 134–144)
Total Protein: 7.3 g/dL (ref 6.0–8.5)

## 2017-09-17 LAB — CBC
HEMATOCRIT: 39 % (ref 34.0–46.6)
Hemoglobin: 13.1 g/dL (ref 11.1–15.9)
MCH: 32 pg (ref 26.6–33.0)
MCHC: 33.6 g/dL (ref 31.5–35.7)
MCV: 95 fL (ref 79–97)
Platelets: 239 10*3/uL (ref 150–379)
RBC: 4.09 x10E6/uL (ref 3.77–5.28)
RDW: 13.5 % (ref 12.3–15.4)
WBC: 4.3 10*3/uL (ref 3.4–10.8)

## 2017-09-17 LAB — TSH: TSH: 3.17 u[IU]/mL (ref 0.450–4.500)

## 2017-09-17 LAB — LIPID PANEL
CHOL/HDL RATIO: 2.1 ratio (ref 0.0–4.4)
Cholesterol, Total: 126 mg/dL (ref 100–199)
HDL: 60 mg/dL (ref >39)
LDL CALC: 53 mg/dL (ref 0–99)
TRIGLYCERIDES: 65 mg/dL (ref 0–149)
VLDL CHOLESTEROL CAL: 13 mg/dL (ref 5–40)

## 2017-09-21 ENCOUNTER — Telehealth: Payer: Self-pay | Admitting: Cardiovascular Disease

## 2017-09-21 NOTE — Telephone Encounter (Signed)
New Message  Pt call requesting to speak with RN about getting lab results. Please cal back to discuss

## 2017-09-21 NOTE — Telephone Encounter (Signed)
Returned call to patient.Recent lab results given.Copy of lab mailed to patient.

## 2017-10-14 ENCOUNTER — Telehealth: Payer: Self-pay | Admitting: Cardiovascular Disease

## 2017-10-14 ENCOUNTER — Ambulatory Visit (INDEPENDENT_AMBULATORY_CARE_PROVIDER_SITE_OTHER): Payer: Medicare Other | Admitting: Certified Nurse Midwife

## 2017-10-14 ENCOUNTER — Encounter: Payer: Self-pay | Admitting: Certified Nurse Midwife

## 2017-10-14 VITALS — BP 118/60 | HR 68 | Resp 16 | Ht 67.25 in | Wt 138.0 lb

## 2017-10-14 DIAGNOSIS — Z01419 Encounter for gynecological examination (general) (routine) without abnormal findings: Secondary | ICD-10-CM | POA: Diagnosis not present

## 2017-10-14 DIAGNOSIS — Z780 Asymptomatic menopausal state: Secondary | ICD-10-CM

## 2017-10-14 DIAGNOSIS — N952 Postmenopausal atrophic vaginitis: Secondary | ICD-10-CM | POA: Diagnosis not present

## 2017-10-14 NOTE — Patient Instructions (Signed)

## 2017-10-14 NOTE — Progress Notes (Signed)
66 y.o. G16P2002 Divorced  Caucasian Fe here for annual exam. Menopausal no HRT.Denies vaginal bleeding or vaginal dryness. Recent heart stent placement in 4/18 due to blockage and doing well. Sees cardiology every 6 months with labs. Desires none today. Sees Dr. Kevan Ny PCP for aex, Prozac medication management. Some vaginal dryness with staying in wet clothes after running for exercise. Denies urinary or bowel issues.  No health issues today. Plans dermatology visit for skin change on left hand.  Participating in Alzheimers study in [place] for research.  Patient's last menstrual period was 08/10/1998 (exact date).          Sexually active: No.  The current method of family planning is post menopausal status.    Exercising: Yes.    running Smoker:  no  Health Maintenance: Pap:  10-09-15 neg HPV HR neg History of Abnormal Pap: no MMG:  10-28-16 category c density birads 1:neg Self Breast exams: yes Colonoscopy:  2015 polyp f/u 45yrs BMD:   2014, pt believes she had another one done, will request TDaP:  2010 Shingles: 2012, 2018 Pneumonia: 2012, 2018 Flu vaccine- done Hep C and HIV: HIV neg previously, Hep c neg 2017 Labs: no at cardiology   reports that she has never smoked. She has never used smokeless tobacco. She reports that she does not drink alcohol or use drugs.  Past Medical History:  Diagnosis Date  . Chest pain   . RA (rheumatoid arthritis) (HCC)   . Transfusion history     Past Surgical History:  Procedure Laterality Date  . CARPAL TUNNEL RELEASE    . CORONARY ANGIOPLASTY     stint  . TENDON RELEASE Right 2006    Current Outpatient Prescriptions  Medication Sig Dispense Refill  . aspirin EC 81 MG tablet Take 81 mg by mouth daily.    Marland Kitchen atorvastatin (LIPITOR) 40 MG tablet Take 40 mg by mouth daily at 6 PM.    . CALCIUM PO Take 600 mg by mouth daily.     . Cholecalciferol (VITAMIN D) 2000 UNITS CAPS Take by mouth.    . Cyanocobalamin (B-12 PO) Take by mouth.     . fish oil-omega-3 fatty acids 1000 MG capsule Take by mouth daily.     Marland Kitchen FLUoxetine (PROZAC) 10 MG capsule Take by mouth daily.  0  . folic acid (FOLVITE) 1 MG tablet Take 1 mg by mouth daily.    . methotrexate (RHEUMATREX) 2.5 MG tablet Take 10 mg by mouth once a week. Caution:Chemotherapy. Protect from light.    . Phosphatidylserine-DHA-EPA (VAYACOG PO) Take 1 capsule by mouth.    . ticagrelor (BRILINTA) 90 MG TABS tablet Take 90 mg by mouth 2 (two) times daily.    . nitroGLYCERIN (NITROSTAT) 0.4 MG SL tablet Place 0.4 mg under the tongue every 5 (five) minutes as needed for chest pain.     No current facility-administered medications for this visit.     Family History  Problem Relation Age of Onset  . Heart failure Mother   . Heart disease Mother   . Lung cancer Mother 22       smoker  . Heart disease Father   . Breast cancer Maternal Grandmother 74  . Diabetes Other   . Heart disease Other   . Colon cancer Neg Hx   . Pancreatic cancer Neg Hx   . Stomach cancer Neg Hx   . Rectal cancer Neg Hx     ROS:  Pertinent items are noted in  HPI.  Otherwise, a comprehensive ROS was negative.  Exam:   BP 118/60   Pulse 68   Resp 16   Ht 5' 7.25" (1.708 m)   Wt 138 lb (62.6 kg)   LMP 08/10/1998 (Exact Date)   BMI 21.45 kg/m  Height: 5' 7.25" (170.8 cm) Ht Readings from Last 3 Encounters:  10/14/17 5' 7.25" (1.708 m)  09/14/17 5\' 7"  (1.702 m)  08/12/17 5\' 7"  (1.702 m)    General appearance: alert, cooperative and appears stated age Head: Normocephalic, without obvious abnormality, atraumatic Neck: no adenopathy, supple, symmetrical, trachea midline and thyroid normal to inspection and palpation Lungs: clear to auscultation bilaterally Breasts: normal appearance, no masses or tenderness, No nipple retraction or dimpling, No nipple discharge or bleeding, No axillary or supraclavicular adenopathy Heart: regular rate and rhythm Abdomen: soft, non-tender; no masses,  no  organomegaly Extremities: extremities normal, atraumatic, no cyanosis or edema Skin: Skin color, texture, turgor normal. No rashes or lesions Lymph nodes: Cervical, supraclavicular, and axillary nodes normal. No abnormal inguinal nodes palpated Neurologic: Grossly normal   Pelvic: External genitalia:  no lesions              Urethra:  normal appearing urethra with no masses, tenderness or lesions              Bartholin's and Skene's: normal                 Vagina: normal appearing vagina with normal color and discharge, no lesions              Cervix: multiparous appearance, no cervical motion tenderness and no lesions              Pap taken: No. Bimanual Exam:  Uterus:  normal size, contour, position, consistency, mobility, non-tender              Adnexa: normal adnexa and no mass, fullness, tenderness               Rectovaginal: Confirms               Anus:  normal sphincter tone, no lesions  Chaperone present: yes  A:  Well Woman with normal exam  Menopausal no HRT  Cardiac event with stent placement in 4/18 doing well under cardiology management.  P:   Reviewed health and wellness pertinent to exam  Aware of need of to evaluate if vaginal bleeding  Continue follow up with cardiology as indicated  Pap smear: no   counseled on breast self exam, mammography screening, feminine hygiene, osteoporosis, adequate intake of calcium and vitamin D, diet and exercise  return annually or prn  An After Visit Summary was printed and given to the patient.

## 2017-10-14 NOTE — Telephone Encounter (Signed)
Spoke with [facility], verbal prescription for atorvastatin and brilinta given

## 2017-10-14 NOTE — Telephone Encounter (Signed)
[place] calling concerning reference#[id number]-This is is concerning her Brilinta and Atorvastatin medication.

## 2017-10-20 ENCOUNTER — Ambulatory Visit: Payer: Medicare Other | Admitting: Nurse Practitioner

## 2018-01-03 ENCOUNTER — Other Ambulatory Visit: Payer: Self-pay

## 2018-04-21 ENCOUNTER — Encounter

## 2018-04-21 ENCOUNTER — Ambulatory Visit: Payer: Self-pay

## 2018-04-21 ENCOUNTER — Ambulatory Visit: Payer: Medicare Other | Admitting: Sports Medicine

## 2018-04-21 ENCOUNTER — Encounter: Payer: Self-pay | Admitting: Sports Medicine

## 2018-04-21 VITALS — BP 115/55 | HR 74 | Ht 67.0 in | Wt 137.0 lb

## 2018-04-21 DIAGNOSIS — M25561 Pain in right knee: Secondary | ICD-10-CM

## 2018-04-21 DIAGNOSIS — S83281A Other tear of lateral meniscus, current injury, right knee, initial encounter: Secondary | ICD-10-CM | POA: Diagnosis not present

## 2018-04-21 NOTE — Assessment & Plan Note (Addendum)
Patient is here with signs and symptoms consistent with an acute traumatic lateral meniscus tear.  Long discussion regarding possible treatment options discussed today.  We will move forward with conservative management for now and will continue this course unless patient's symptoms persist or worsen. -Compression sleeve provided today. -Encouraged regular icing and elevation. -Discussed avoidance of high intensity/high impact exercises -Home exercise program provided today. -Follow-up 4 weeks for repeat ultrasound (assess any improvement in knee effusion)

## 2018-04-21 NOTE — Progress Notes (Signed)
HPI  CC: Acute onset right knee pain Patient is here with complaints of acute onset right-sided knee pain and new "popping".  She states that approximately 1 week ago she had been stepping up onto a ladder when she felt/heard a loud "pop" from her right knee.  She states that she had immediate pain and was unable to tolerate weightbearing immediately thereafter.  She had difficulty weightbearing for the following 2 to 3 days.  She denies any noticeable swelling.  She states that she has had some new milder "popping" in this knee ever since this initial episode.  She denies any mechanical locking or catching.  No feelings of instability.  No weakness, numbness, or paresthesias.  No prior knee injuries.  Medications/Interventions Tried: relative rest  See HPI and/or previous note for associated ROS.  Objective: BP (!) 115/55   Pulse 74   Ht 5\' 7"  (1.702 m)   Wt 137 lb (62.1 kg)   LMP 08/10/1998 (Exact Date)   BMI 21.46 kg/m  Gen: NAD, well groomed, a/o x3, normal affect.  CV: Well-perfused. Warm.  Resp: Non-labored.  Neuro: Sensation intact throughout. No gross coordination deficits.  Gait: Nonpathologic posture, unremarkable stride without signs of limp or balance issues. Knee, Right: TTP noted at the lateral joint line. Inspection was negative for erythema, ecchymosis, and effusion. No obvious bony abnormalities or signs of osteophyte development. Palpation yielded no asymmetric warmth; No patellar tenderness; +1 patellar crepitus. Patellar and quadriceps tendons unremarkable, and no tenderness of the pes anserine bursa. No obvious Baker's cyst development. ROM normal in flexion (135 degrees) and extension (0 degrees). Normal hamstring and quadriceps strength. Neurovascularly intact bilaterally.  - Ligaments: (Solid and consistent endpoints)   - ACL (present bilaterally)   - PCL (present bilaterally)   - LCL (present bilaterally)   - MCL (present bilaterally).   - Meniscus:   -  Thessaly: NEG  - Patella:   - Patellar grind/compression: NEG   - Patellar glide: Without apprehension   ULTRASOUND: Knee, Right Diagnostic limited ultrasound imaging obtained of patient's Right knee.  - Quadriceps tendon: No appreciated signs of tearing, edema, or calcification.  - Suprapatellar pouch: Notable fluid/edema noted within the medial and lateral pouches consistent with a moderate effusion. No evidence of crystallization. - Patellar tendon: No appreciated signs of tearing, edema, or calcification. No infrapatellar or tibial tuberosity fluid or abnormality appreciated.  - Medial joint line: No signs concerning for meniscal pathology appreciated. No increased fluid presence noted. No evidence of osteophyte development or significant joint space loss.  - Lateral joint line: Notable lateral fluid accumulation with possible meniscal pseudocyst formation.  Some splitting presents as well as some posterior displacement of the meniscus itself. No evidence of osteophyte development or significant joint space loss.  - Popliteal fossa: No evidence of Baker's cyst formation. Vasculature unremarkable. - MCL: No evidence of integrity loss or abnormal fluid presence.  - LCL: No evidence of integrity loss or abnormal fluid presence.   IMPRESSION: findings consistent with moderate knee effusion and acute traumatic lateral meniscus tear with possible pseudocyst formation.  Ultrasound and interpretation by 08/12/1998, MD and Mickie Hillier. Barbara Rials, MD   Assessment and plan:  Acute lateral meniscus tear of right knee Patient is here with signs and symptoms consistent with an acute traumatic lateral meniscus tear.  Long discussion regarding possible treatment options discussed today.  We will move forward with conservative management for now and will continue this course unless patient's symptoms persist or worsen. -  Compression sleeve provided today. -Encouraged regular icing and elevation. -Discussed  avoidance of high intensity/high impact exercises -Home exercise program provided today. -Follow-up 4 weeks for repeat ultrasound (assess any improvement in knee effusion)   Orders Placed This Encounter  Procedures  . [place] COMPLETE JOINT SPACE STRUCTURES LOW RIGHT    Standing Status:   Future    Number of Occurrences:   1    Standing Expiration Date:   06/21/2019    Order Specific Question:   Reason for Exam (SYMPTOM  OR DIAGNOSIS REQUIRED)    Answer:   right knee pain    Order Specific Question:   Preferred imaging location?    Answer:   Internal    Kathee Delton, MD,MS [facility] Fellow 04/21/2018 6:09 PM I observed and examined the patient with the [facility] Fellow and agree with assessment and plan.  Note reviewed and modified by me. Enid Baas, MD

## 2018-05-24 ENCOUNTER — Ambulatory Visit: Payer: Medicare Other | Admitting: Sports Medicine

## 2018-06-14 ENCOUNTER — Ambulatory Visit: Payer: Medicare Other | Admitting: Sports Medicine

## 2018-06-14 ENCOUNTER — Ambulatory Visit: Payer: Self-pay

## 2018-06-14 ENCOUNTER — Encounter: Payer: Self-pay | Admitting: Sports Medicine

## 2018-06-14 VITALS — BP 104/70 | Ht 67.0 in | Wt 140.0 lb

## 2018-06-14 DIAGNOSIS — M25561 Pain in right knee: Secondary | ICD-10-CM | POA: Diagnosis not present

## 2018-06-14 DIAGNOSIS — S83281A Other tear of lateral meniscus, current injury, right knee, initial encounter: Secondary | ICD-10-CM

## 2018-06-14 NOTE — Progress Notes (Signed)
F/u RT degenerative lateral meniscus  Patient enters about 6 weeks following a lateral meniscus tear Ultrasound at the time showed a significant effusion and a para meniscal cyst She states that she was very compliant with rest She did her home exercises with straight leg raises and lateral leg raises No real pain after the past week She is actually run on it a couple times this week with no increase in pain  Past history   most remarkable for long-standing rheumatoid arthritis She has been maintained on methotrexate now 12.5 mg weekly Primary areas of involvement have been the hands  Review of systems No locking No giving way No significant swelling Morning she has stiffness particularly in both hands and sometimes the wrists  Physical exam Thin female in no acute distress BP 104/70   Ht 5\' 7"  (1.702 m)   Wt 140 lb (63.5 kg)   LMP 08/10/1998 (Exact Date)   BMI 21.93 kg/m   Knee: RT Normal to inspection with no erythema or effusion or obvious bony abnormalities. Palpation normal with no warmth or joint line tenderness or patellar tenderness or condyle tenderness. ROM normal in flexion and extension and lower leg rotation. Ligaments with solid consistent endpoints including ACL, PCL, LCL, MCL. Negative Mcmurray's and provocative meniscal tests. Non painful patellar compression. Patellar and quadriceps tendons unremarkable. Hamstring and quadriceps strength is normal.  Note: left knee is also normal but shows more PF crepitation and tracks laterally  Ultrasound of right knee Suprapatellar pouch the effusion has resolved and she has physiologic fluid Medial meniscus unremarkable Lateral meniscus shows resolution of the para meniscal cyst and almost all of the fluid within the joint space has resolved Both joint lines show preserved space and no spurring Quadriceps and patellar tendons are normal  Impression: resolution of acute effusion with less changes of the  degenerative meniscus noted laterally  Ultrasound and interpretation by 08/12/1998. Sibyl Parr, MD

## 2018-06-14 NOTE — Assessment & Plan Note (Signed)
This has responded very well to conservative care Continue strengthening exercises Gradually ease back into running over the next month Continue with her standard care for her rheumatoid arthritis  Recheck by me as needed

## 2018-06-22 ENCOUNTER — Other Ambulatory Visit: Payer: Self-pay | Admitting: Cardiovascular Disease

## 2018-08-31 ENCOUNTER — Encounter: Payer: Self-pay | Admitting: Cardiovascular Disease

## 2018-09-26 ENCOUNTER — Encounter: Payer: Self-pay | Admitting: Certified Nurse Midwife

## 2018-10-09 ENCOUNTER — Other Ambulatory Visit: Payer: Self-pay | Admitting: Cardiovascular Disease

## 2018-10-10 NOTE — Telephone Encounter (Signed)
Rx has been sent to the pharmacy electronically. ° °

## 2018-10-17 ENCOUNTER — Encounter: Payer: Self-pay | Admitting: Cardiovascular Disease

## 2018-10-17 ENCOUNTER — Encounter

## 2018-10-17 ENCOUNTER — Ambulatory Visit: Payer: Medicare Other | Admitting: Cardiovascular Disease

## 2018-10-17 VITALS — BP 115/74 | HR 58 | Ht 67.0 in | Wt 142.8 lb

## 2018-10-17 DIAGNOSIS — I2510 Atherosclerotic heart disease of native coronary artery without angina pectoris: Secondary | ICD-10-CM

## 2018-10-17 DIAGNOSIS — M069 Rheumatoid arthritis, unspecified: Secondary | ICD-10-CM

## 2018-10-17 DIAGNOSIS — R0789 Other chest pain: Secondary | ICD-10-CM | POA: Diagnosis not present

## 2018-10-17 DIAGNOSIS — E785 Hyperlipidemia, unspecified: Secondary | ICD-10-CM | POA: Diagnosis not present

## 2018-10-17 DIAGNOSIS — Z9861 Coronary angioplasty status: Secondary | ICD-10-CM | POA: Diagnosis not present

## 2018-10-17 DIAGNOSIS — Z79899 Other long term (current) drug therapy: Secondary | ICD-10-CM

## 2018-10-17 MED ORDER — TICAGRELOR 60 MG PO TABS: 60.0000 mg | ORAL_TABLET | Freq: Two times a day (BID) | ORAL | 3 refills | Status: DC

## 2018-10-17 NOTE — Progress Notes (Addendum)
Cardiology Office Note    Date:  10/17/2018   ID:  Barbara Ayers, DOB 1951-12-24, MRN [id number]  PCP:  Darcus Austin, MD  Cardiologist:  Shelva Majestic, MD   F/u cardiology evaluation: Referred by [name] at [facility]  History of Present Illness:  Barbara Ayers is a 67 y.o. female who I saw on September 14, 2017 through the referral of  [name] to establish cardiology care.  She presents for follow-up evaluation  Ms. Barbara Ayers is an active female who has a history of rheumatoid arthritis for approximately 20 years.  She has routinely exercised.  In 2018 she began to notice exertional symptoms of vague chest tightness when running.  She is a personal friend of [name] ultimately was evaluated by her  in [place], [place].  She underwent an echo Doppler study which showed an EF of 55% with normal diastolic function.  There was trace to mild aortic insufficiency, mild MR and TR.  She was referred for a nuclear stress test which revealed equivocal findings for anteroseptal ischemia, although she did experience chest pain and had ischemic ECG abnormalities and probable rate related bundle branch block and a brief episode of nonsustained VT.  Her chest pain required approximately 55 minutes for complete resolution in recovery.  She  was referred to Dr. [name] at [facility] in cardiac catheterization on 04/15/2017 showed a 99% ostial stenosis of her LAD which was successfully stented with a 3.012 mm Resolute onyx DES stent postdilated to 3.25 mm.  She was also found to have mild 20% ostial left main narrowing, 40% mid LAD stenosis and 20% mid RCA stenosis.  Following her intervention, she has been pain-free.  She is back exercising and denies any exertional symptomatology.  She has seen Dr. Mathis Bud in follow-up.  Since the patient lives in [place] she was referred by Dr. Wendi Snipes to me to continue her cardiology care.  When I saw  her for initial evaluation in September 2018 she was doing well.  She specifically denies recurrent anginal symptoms, palpitations, presyncope or syncope.  She is followed by Dr. Inda Merlin for primary care.    Over the past year she has remained active.  She does yoga several times per week and is not been running as much as she had in the past.  Recently she has started to notice a vague left upper chest sensation which seems to be exacerbated when she lifts her arms above her head suggesting a possible musculoskeletal etiology.  However this is new.  She is unaware of any definitive exertional precipitation.  She has continued to be on aspirin plus Brilinta 90 mg twice a day and denies bleeding.  She continues to be on methotrexate weekly.  She is on Prozac 10 mg daily.  She presents for reevaluation.   Past Medical History:  Diagnosis Date  . Chest pain   . RA (rheumatoid arthritis) ([place])   . Transfusion history     Past Surgical History:  Procedure Laterality Date  . CARPAL TUNNEL RELEASE    . CORONARY ANGIOPLASTY     stint  . TENDON RELEASE Right 2006    Current Medications: Outpatient Medications Prior to Visit  Medication Sig Dispense Refill  . aspirin EC 81 MG tablet Take 81 mg by mouth daily.    Marland Kitchen atorvastatin (LIPITOR) 40 MG tablet Take 1 tablet (40 mg total) by mouth daily. Need appointment 90 tablet 0  .  CALCIUM PO Take 600 mg by mouth daily.     . Cholecalciferol (VITAMIN D) 2000 UNITS CAPS Take by mouth.    . Cyanocobalamin (B-12 PO) Take by mouth.    . fish oil-omega-3 fatty acids 1000 MG capsule Take by mouth daily.     Marland Kitchen FLUoxetine (PROZAC) 10 MG capsule Take by mouth daily.  0  . folic acid (FOLVITE) 1 MG tablet Take 1 mg by mouth daily.    . methotrexate (RHEUMATREX) 2.5 MG tablet Take 10 mg by mouth once a week. Caution:Chemotherapy. Protect from light.    . nitroGLYCERIN (NITROSTAT) 0.4 MG SL tablet Place 0.4 mg under the tongue every 5 (five) minutes as needed for  chest pain.    Marland Kitchen BRILINTA 90 MG TABS tablet TAKE 1 TABLET BY MOUTH  TWICE A DAY 180 tablet 1  . Phosphatidylserine-DHA-EPA (VAYACOG PO) Take 1 capsule by mouth.     No facility-administered medications prior to visit.      Allergies:   Amoxicillin   Social History   Socioeconomic History  . Marital status: Divorced    Spouse name: Not on file  . Number of children: Not on file  . Years of education: Not on file  . Highest education level: Not on file  Occupational History  . Not on file  Social Needs  . Financial resource strain: Not on file  . Food insecurity:    Worry: Not on file    Inability: Not on file  . Transportation needs:    Medical: Not on file    Non-medical: Not on file  Tobacco Use  . Smoking status: Never Smoker  . Smokeless tobacco: Never Used  Substance and Sexual Activity  . Alcohol use: No  . Drug use: No  . Sexual activity: Never    Birth control/protection: Post-menopausal  Lifestyle  . Physical activity:    Days per week: Not on file    Minutes per session: Not on file  . Stress: Not on file  Relationships  . Social connections:    Talks on phone: Not on file    Gets together: Not on file    Attends religious service: Not on file    Active member of club or organization: Not on file    Attends meetings of clubs or organizations: Not on file    Relationship status: Not on file  Other Topics Concern  . Not on file  Social History Narrative  . Not on file    Additional social history is notable in that she is divorced for 8 years.  She has 2 children and 2 grandchildren.  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  There is no history of tobacco use or alcohol use.  She exercises at least 5 days per week.  Family History:  The patient's family history includes Breast cancer (age of onset: 54) in her maternal grandmother; Diabetes in her other; Heart disease in her father, mother, and other; Heart failure in her mother;  Lung cancer (age of onset: 11) in her mother.  Her father had suffered an initial MI at age 36. She has 2 sisters who are ages 72 and 60.  ROS General: Negative; No fevers, chills, or night sweats;  HEENT: Negative; No changes in vision or hearing, sinus congestion, difficulty swallowing Pulmonary: Negative; No cough, wheezing, shortness of breath, hemoptysis Cardiovascular: See history of present illness GI: Negative; No nausea, vomiting, diarrhea, or abdominal pain GU: Negative; No  dysuria, hematuria, or difficulty voiding Musculoskeletal: Positive for rheumatoid arthritis Hematologic/Oncology: Negative; no easy bruising, bleeding Endocrine: Negative; no heat/cold intolerance; no diabetes Neuro: Negative; no changes in balance, headaches Skin: Negative; No rashes or skin lesions Psychiatric: Negative; No behavioral problems, depression Sleep: Negative; No snoring, daytime sleepiness, hypersomnolence, bruxism, restless legs, hypnogognic hallucinations, no cataplexy Other comprehensive 14 point system review is negative.   PHYSICAL EXAM:   VS:  BP 115/74   Pulse (!) 58   Ht '5\' 7"'$  (1.702 m)   Wt 142 lb 12.8 oz (64.8 kg)   LMP 08/10/1998 (Exact Date)   BMI 22.37 kg/m     Repeat blood pressure by me was 112/70  Wt Readings from Last 3 Encounters:  10/17/18 142 lb 12.8 oz (64.8 kg)  06/14/18 140 lb (63.5 kg)  04/21/18 137 lb (62.1 kg)     General: Alert, oriented, no distress.  Skin: normal turgor, no rashes, warm and dry HEENT: Normocephalic, atraumatic. Pupils equal round and reactive to light; sclera anicteric; extraocular muscles intact;  Nose without nasal septal hypertrophy Mouth/Parynx benign; Mallinpatti scale 2 Neck: No JVD, no carotid bruits; normal carotid upstroke Lungs: clear to ausculatation and percussion; no wheezing or rales Chest wall: without tenderness to palpitation; no tenderness in the region of the pectoralis muscle or shoulder Heart: PMI not  displaced, RRR, s1 s2 normal, intermittent systolic click with faint 1/6 systolic murmur, no diastolic murmur, no rubs, gallops, thrills, or heaves Abdomen: soft, nontender; no hepatosplenomehaly, BS+; abdominal aorta nontender and not dilated by palpation. Back: no CVA tenderness Pulses 2+ Musculoskeletal: full range of motion, normal strength, no joint deformities Extremities: no clubbing cyanosis or edema, Homan's sign negative  Neurologic: grossly nonfocal; Cranial nerves grossly wnl Psychologic: Normal mood and affect   Studies/Labs Reviewed:   EKG:  EKG is ordered today. ECG (independently read by me): Sinus bradycardia 58 bpm.  Normal intervals.  No ectopy.  No significant ST-T abnormalities.  September 14, 2017 ECG (independently read by me): normal sinus rhythm at 72 bpm.  Normal intervals.  Mild RV conduction delay.  No significant ST T abnormalities.  Recent Labs: BMP Latest Ref Rng & Units 09/16/2017 10/08/2014  Glucose 65 - 99 mg/dL 81 82  BUN 8 - 27 mg/dL 18 19  Creatinine 0.57 - 1.00 mg/dL 0.82 0.89  BUN/Creat Ratio 12 - 28 22 -  Sodium 134 - 144 mmol/L 140 141  Potassium 3.5 - 5.2 mmol/L 4.3 4.8  Chloride 96 - 106 mmol/L 101 103  CO2 20 - 29 mmol/L 24 27  Calcium 8.7 - 10.3 mg/dL 9.2 9.7     Hepatic Function Latest Ref Rng & Units 09/16/2017  Total Protein 6.0 - 8.5 g/dL 7.3  Albumin 3.6 - 4.8 g/dL 4.4  AST 0 - 40 IU/L 28  ALT 0 - 32 IU/L 22  Alk Phosphatase 39 - 117 IU/L 88  Total Bilirubin 0.0 - 1.2 mg/dL 0.7    CBC Latest Ref Rng & Units 09/16/2017 10/14/2016 10/14/2016  WBC 3.4 - 10.8 x10E3/uL 4.3 4.1 -  Hemoglobin 11.1 - 15.9 g/dL 13.1 14.1 14.2  Hematocrit 34.0 - 46.6 % 39.0 42.6 -  Platelets 150 - 379 x10E3/uL 239 229 -   Lab Results  Component Value Date   MCV 95 09/16/2017   MCV 98.8 10/14/2016   Lab Results  Component Value Date   TSH 3.170 09/16/2017   No results found for: HGBA1C   BNP No results found for: BNP  ProBNP  No results  found for: PROBNP   Lipid Panel     Component Value Date/Time   CHOL 126 09/16/2017 0828   TRIG 65 09/16/2017 0828   HDL 60 09/16/2017 0828   CHOLHDL 2.1 09/16/2017 0828   CHOLHDL 3.0 10/14/2016 1410   VLDL 12 10/14/2016 1410   LDLCALC 53 09/16/2017 0828     RADIOLOGY: No results found.   Additional studies/ records that were reviewed today include:  I [facility].  I reviewed the patient's noninvasive studies and cardiac catheterization and PCI records.   ASSESSMENT:    1. CAD S/P percutaneous coronary angioplasty   2. Atypical chest pain   3. Hyperlipidemia with target LDL less than 70   4. Rheumatoid arthritis, involving unspecified site, unspecified rheumatoid factor presence ([place])   5. Medication management      PLAN:  Ms Gustavo Meditz is a very pleasant 67 year old female who has a greater than 20 year history of rheumatoid arthritis and d developed new onset exertional chest pressure which occurred while running in 2018.  She was found to have a 99% ostial LAD stenosis as the culprit lesion contributing to her symptomatology and underwent successful DES stenting with insertion of a Resolute onyx 3.012 mm DES stent on 04/15/2017.  When I saw her for initial evaluation she was without recurrent anginal symptomatology and had resumed physical activity and denied any exertional symptoms.  On her initial stress test she also had experienced an episode of nonsustained VT.  She denies any palpitations.  She is not on any beta-blocker therapy and continues to be bradycardic.  Her physical exam suggest the possibility of mitral valve prolapse which was not detected on 2D echo Doppler imaging.  She has recently developed somewhat atypical upper left lateral pectoral chest discomfort.  She denies any definitive clear-cut exertional precipitation.  I have recommended that she undergo a follow-up exercise Myoview study to  reassess particularly since her LAD lesion was ostial and she also had concomitant CAD.  Since she is over 18 months since her placement of her DES stent, I have suggested reducing her Brilinta to a 60 mg twice daily regimen and also discussed the possibility of switching to clopidogrel.  She would prefer to stay on Brilinta.  She had follow-up laboratory with her primary physician in September.  Her most recent lipid studies are excellent with an LDL of 38 on her current dose of atorvastatin 40 mg.  LFTs were normal on methotrexate.  Her blood pressure today is stable.  I will see her in 3 months for follow-up evaluation or sooner as necessary.    Medication Adjustments/Labs and Tests Ordered: Current medicines are reviewed at length with the patient today.  Concerns regarding medicines are outlined above.  Medication changes, Labs and Tests ordered today are listed in the Patient Instructions below.  Patient Instructions  Medication Instructions:  DECREASE Brilinta to 60 mg two times daily  If you need a refill on your cardiac medications before your next appointment, please call your pharmacy.   Testing/Procedures: Your physician has requested that you have an exercise stress myoview. For further information please visit [email or web address]. Please follow instruction sheet, as given.  Follow-Up: At [facility], you and your health needs are our priority.  As part of our continuing mission to provide you with exceptional heart care, we have created designated Provider Care Teams.  These Care Teams include your primary Cardiologist (physician) and Advanced  Practice Providers (APPs -  Physician Assistants and Nurse Practitioners) who all work together to provide you with the care you need, when you need it. You will need a follow up appointment in 1 months (after stress test).  Please call our office 2 months in advance to schedule this appointment.  You may see Dr. Claiborne Billings or one of the following  Advanced Practice Providers on your designated Care Team: [place], [place] . Fabian Sharp, PA-C       Signed, Shelva Majestic, MD, [facility]  10/17/2018 6:46 PM    [facility] [address] [place], [place], [place]  [address] Phone: [phone]

## 2018-10-17 NOTE — Patient Instructions (Signed)
Medication Instructions:  DECREASE Brilinta to 60 mg two times daily  If you need a refill on your cardiac medications before your next appointment, please call your pharmacy.   Testing/Procedures: Your physician has requested that you have an exercise stress myoview. For further information please visit [email or web address] Please follow instruction sheet, as given.  Follow-Up: At [facility], you and your health needs are our priority.  As part of our continuing mission to provide you with exceptional heart care, we have created designated Provider Care Teams.  These Care Teams include your primary Cardiologist (physician) and Advanced Practice Providers (APPs -  Physician Assistants and Nurse Practitioners) who all work together to provide you with the care you need, when you need it. You will need a follow up appointment in 1 months (after stress test).  Please call our office 2 months in advance to schedule this appointment.  You may see Dr. Tresa Endo or one of the following Advanced Practice Providers on your designated Care Team: [place], [place] . Micah Flesher, PA-C

## 2018-10-18 ENCOUNTER — Other Ambulatory Visit: Payer: Self-pay

## 2018-10-18 ENCOUNTER — Other Ambulatory Visit (HOSPITAL_COMMUNITY)
Admission: RE | Admit: 2018-10-18 | Discharge: 2018-10-18 | Disposition: A | Discharge status: Home / Self Care | Payer: Medicare Other | Source: Ambulatory Visit | Attending: Obstetrics & Gynecology | Admitting: Obstetrics & Gynecology

## 2018-10-18 ENCOUNTER — Ambulatory Visit (INDEPENDENT_AMBULATORY_CARE_PROVIDER_SITE_OTHER): Payer: Medicare Other | Admitting: Certified Nurse Midwife

## 2018-10-18 ENCOUNTER — Encounter: Payer: Self-pay | Admitting: Certified Nurse Midwife

## 2018-10-18 VITALS — BP 104/62 | HR 68 | Resp 16 | Ht 67.25 in | Wt 142.0 lb

## 2018-10-18 DIAGNOSIS — N952 Postmenopausal atrophic vaginitis: Secondary | ICD-10-CM | POA: Diagnosis not present

## 2018-10-18 DIAGNOSIS — Z01419 Encounter for gynecological examination (general) (routine) without abnormal findings: Secondary | ICD-10-CM | POA: Diagnosis not present

## 2018-10-18 DIAGNOSIS — N951 Menopausal and female climacteric states: Secondary | ICD-10-CM

## 2018-10-18 DIAGNOSIS — Z124 Encounter for screening for malignant neoplasm of cervix: Secondary | ICD-10-CM | POA: Diagnosis not present

## 2018-10-18 NOTE — Progress Notes (Signed)
67 y.o. G6P2002 Divorced  Caucasian Fe here for annual exam. Periods none, menopausal. Denies vaginal bleeding. Some vaginal dryness and not using any moisturizer. Using Dial soap to clean with . Sees Shaune Pollack for aex, labs, and Prozac management for depression and Lipitor for cholesterol. Continues  Rheumatology three times yearly for medication management. Cardiology for stent management once yearly. Continues to run and do Yoga daily for exercise. No health issues today.  Patient's last menstrual period was 08/10/1998 (exact date).          Sexually active: No.  The current method of family planning is post menopausal status.    Exercising: Yes.    run & yoga Smoker:  no  Review of Systems  Constitutional:       Weight gain  HENT: Negative.   Eyes: Negative.   Cardiovascular:       Stent  Gastrointestinal: Negative.   Genitourinary: Negative.   Musculoskeletal:       Muscle or joint pain-RA  Neurological: Negative.   Endo/Heme/Allergies: Negative.   Psychiatric/Behavioral: Negative.     Health Maintenance: Pap:  10-09-15 neg HPV HR neg History of Abnormal Pap: no MMG:  9/19 bilateral & left breast u/s neg Self Breast exams: yes Colonoscopy:  2015 polyp f/u 34yrs BMD:   2014 TDaP:  2010 Shingles: 2012, 2018 Pneumonia: 2012,2018 Hep C and HIV: HIV neg previously, Hep c neg 2017 Labs:  If needed   reports that she has never smoked. She has never used smokeless tobacco. She reports that she does not drink alcohol or use drugs.  Past Medical History:  Diagnosis Date  . Chest pain   . RA (rheumatoid arthritis) ([facility])   . Transfusion history     Past Surgical History:  Procedure Laterality Date  . CARPAL TUNNEL RELEASE    . CORONARY ANGIOPLASTY     stint  . TENDON RELEASE Right 2006    Current Outpatient Medications  Medication Sig Dispense Refill  . aspirin EC 81 MG tablet Take 81 mg by mouth daily.    Marland Kitchen atorvastatin (LIPITOR) 40 MG tablet Take 1 tablet (40 mg  total) by mouth daily. Need appointment 90 tablet 0  . CALCIUM PO Take 600 mg by mouth daily.     . Chlorpheniramine Maleate (ALLERGY PO) Take by mouth.    . Cholecalciferol (VITAMIN D) 2000 UNITS CAPS Take by mouth.    . fish oil-omega-3 fatty acids 1000 MG capsule Take by mouth daily.     Marland Kitchen FLUoxetine (PROZAC) 10 MG capsule Take by mouth daily.  0  . folic acid (FOLVITE) 1 MG tablet Take 1 mg by mouth daily.    . methotrexate (RHEUMATREX) 2.5 MG tablet Take 10 mg by mouth once a week. Caution:Chemotherapy. Protect from light.    . ticagrelor (BRILINTA) 60 MG TABS tablet Take 1 tablet (60 mg total) by mouth 2 (two) times daily. 180 tablet 3  . nitroGLYCERIN (NITROSTAT) 0.4 MG SL tablet Place 0.4 mg under the tongue every 5 (five) minutes as needed for chest pain.     No current facility-administered medications for this visit.     Family History  Problem Relation Age of Onset  . Heart failure Mother   . Heart disease Mother   . Lung cancer Mother 50       smoker  . Heart disease Father   . Breast cancer Maternal Grandmother 60  . Diabetes Other   . Heart disease Other   . Colon  cancer Neg Hx   . Pancreatic cancer Neg Hx   . Stomach cancer Neg Hx   . Rectal cancer Neg Hx     ROS:  Pertinent items are noted in HPI.  Otherwise, a comprehensive ROS was negative.  Exam:   BP 104/62   Pulse 68   Resp 16   Ht 5' 7.25" (1.708 m)   Wt 142 lb (64.4 kg)   LMP 08/10/1998 (Exact Date)   BMI 22.08 kg/m  Height: 5' 7.25" (170.8 cm) Ht Readings from Last 3 Encounters:  10/18/18 5' 7.25" (1.708 m)  10/17/18 5\' 7"  (1.702 m)  06/14/18 5\' 7"  (1.702 m)    General appearance: alert, cooperative and appears stated age Head: Normocephalic, without obvious abnormality, atraumatic Neck: no adenopathy, supple, symmetrical, trachea midline and thyroid normal to inspection and palpation Lungs: clear to auscultation bilaterally Breasts: normal appearance, no masses or tenderness, No nipple  retraction or dimpling, No nipple discharge or bleeding, No axillary or supraclavicular adenopathy Heart: regular rate and rhythm Abdomen: soft, non-tender; no masses,  no organomegaly Extremities: extremities normal, atraumatic, no cyanosis or edema Skin: Skin color, texture, turgor normal. No rashes or lesions Lymph nodes: Cervical, supraclavicular, and axillary nodes normal. No abnormal inguinal nodes palpated Neurologic: Grossly normal   Pelvic: External genitalia:  no lesions              Urethra:  normal appearing urethra with no masses, tenderness or lesions              Bartholin's and Skene's: normal                 Vagina: normal appearing vagina with normal color and discharge, no lesions              Cervix: no cervical motion tenderness, no lesions and normal appearance              Pap taken: Yes.   Bimanual Exam:  Uterus:  normal size, contour, position, consistency, mobility, non-tender and anteverted              Adnexa: normal adnexa and no mass, fullness, tenderness               Rectovaginal: Confirms               Anus:  normal sphincter tone, no lesions  Chaperone present: yes  A:  Well Woman with normal exam  Post menopausal no HRT  Vaginal dryness  Cholesterol, Prozac, RA with MD management  P:   Reviewed health and wellness pertinent to exam  Aware of need to advise if vaginal bleeding  Discussed Olive oil or coconut oil use for vaginal dryness.  Continue follow up with MD as indicated.  Pap smear: yes   counseled on breast self exam, mammography screening, adequate intake of calcium and vitamin D, diet and exercise  return annually or prn  An After Visit Summary was printed and given to the patient.

## 2018-10-18 NOTE — Patient Instructions (Signed)

## 2018-10-20 LAB — CYTOLOGY - PAP
Diagnosis: NEGATIVE
HPV (WINDOPATH): NOT DETECTED

## 2018-10-26 ENCOUNTER — Telehealth (HOSPITAL_COMMUNITY): Payer: Self-pay

## 2018-10-26 ENCOUNTER — Ambulatory Visit: Payer: Medicare Other | Admitting: Certified Nurse Midwife

## 2018-10-26 NOTE — Telephone Encounter (Signed)
Encounter complete. 

## 2018-11-01 ENCOUNTER — Encounter (HOSPITAL_COMMUNITY): Payer: Self-pay

## 2018-11-01 ENCOUNTER — Encounter (HOSPITAL_COMMUNITY): Payer: Self-pay | Admitting: *Deleted

## 2018-11-01 ENCOUNTER — Ambulatory Visit (HOSPITAL_COMMUNITY)
Admission: RE | Admit: 2018-11-01 | Discharge: 2018-11-01 | Disposition: A | Discharge status: Home / Self Care | Payer: Medicare Other | Source: Ambulatory Visit | Attending: Internal Medicine | Admitting: Internal Medicine

## 2018-11-01 DIAGNOSIS — Z9861 Coronary angioplasty status: Secondary | ICD-10-CM | POA: Insufficient documentation

## 2018-11-01 DIAGNOSIS — I2510 Atherosclerotic heart disease of native coronary artery without angina pectoris: Secondary | ICD-10-CM

## 2018-11-01 DIAGNOSIS — R0789 Other chest pain: Secondary | ICD-10-CM | POA: Insufficient documentation

## 2018-11-01 LAB — MYOCARDIAL PERFUSION IMAGING
CHL CUP MPHR: 153 {beats}/min
CHL CUP NUCLEAR SRS: 1
CHL CUP NUCLEAR SSS: 5
CHL CUP RESTING HR STRESS: 59 {beats}/min
CSEPED: 9 min
CSEPEDS: 49 s
CSEPHR: 101 %
CSEPPHR: 155 {beats}/min
Estimated workload: 11.4 METS
LV sys vol: 34 mL
LVDIAVOL: 80 mL (ref 46–106)
RPE: 17
SDS: 4
TID: 1.1

## 2018-11-01 MED ORDER — TECHNETIUM TC 99M TETROFOSMIN IV KIT
10.5000 | PACK | Freq: Once | INTRAVENOUS | Status: AC | PRN
  Administered 2018-11-01: 10.5 via INTRAVENOUS
  Filled 2018-11-01: qty 11

## 2018-11-01 MED ORDER — TECHNETIUM TC 99M TETROFOSMIN IV KIT
30.7000 | PACK | Freq: Once | INTRAVENOUS | Status: AC | PRN
  Administered 2018-11-01: 30.7 via INTRAVENOUS
  Filled 2018-11-01: qty 31

## 2018-11-29 ENCOUNTER — Encounter: Payer: Self-pay | Admitting: Cardiovascular Disease

## 2018-11-29 ENCOUNTER — Ambulatory Visit: Payer: Medicare Other | Admitting: Cardiovascular Disease

## 2018-11-29 VITALS — BP 113/74 | HR 69 | Ht 67.0 in | Wt 146.4 lb

## 2018-11-29 DIAGNOSIS — Z9861 Coronary angioplasty status: Secondary | ICD-10-CM

## 2018-11-29 DIAGNOSIS — E785 Hyperlipidemia, unspecified: Secondary | ICD-10-CM | POA: Diagnosis not present

## 2018-11-29 DIAGNOSIS — R0789 Other chest pain: Secondary | ICD-10-CM | POA: Diagnosis not present

## 2018-11-29 DIAGNOSIS — M069 Rheumatoid arthritis, unspecified: Secondary | ICD-10-CM

## 2018-11-29 DIAGNOSIS — I2510 Atherosclerotic heart disease of native coronary artery without angina pectoris: Secondary | ICD-10-CM | POA: Diagnosis not present

## 2018-11-29 NOTE — Progress Notes (Signed)
Cardiology Office Note    Date:  12/01/2018   ID:  Barbara ORTEZ, DOB 1951/08/15, MRN [id number]  PCP:  Darcus Austin, MD  Cardiologist:  Shelva Majestic, MD   F/u cardiology evaluation: Referred by [name] at [facility]  History of Present Illness:  Barbara Ayers is a 67 y.o. female who I saw on September 14, 2017 through the referral of  [name] to establish cardiology care.  She presents for follow-up evaluation  Barbara Ayers is an active female who has a history of rheumatoid arthritis for approximately 20 years.  She has routinely exercised.  In 2018 she began to notice exertional symptoms of vague chest tightness when running.  She is a personal friend of [name] ultimately was evaluated by her  in [place], [place].  She underwent an echo Doppler study which showed an EF of 55% with normal diastolic function.  There was trace to mild aortic insufficiency, mild MR and TR.  She was referred for a nuclear stress test which revealed equivocal findings for anteroseptal ischemia, although she did experience chest pain and had ischemic ECG abnormalities and probable rate related bundle branch block and a brief episode of nonsustained VT.  Her chest pain required approximately 55 minutes for complete resolution in recovery.  She  was referred to Dr. [name] at [facility] in cardiac catheterization on 04/15/2017 showed a 99% ostial stenosis of her LAD which was successfully stented with a 3.012 mm Resolute onyx DES stent postdilated to 3.25 mm.  She was also found to have mild 20% ostial left main narrowing, 40% mid LAD stenosis and 20% mid RCA stenosis.  Following her intervention, she has been pain-free.  She is back exercising and denies any exertional symptomatology.  She has seen Dr. Mathis Bud in follow-up.  Since the patient lives in [place] she was referred by Dr. Wendi Snipes to me to continue her cardiology care.  When I saw her  for initial evaluation in September 2018 she was doing well.  She specifically denies recurrent anginal symptoms, palpitations, presyncope or syncope.  She is followed by Dr. Inda Merlin for primary care.    Over the past year she has remained active.  She does yoga several times per week and is not been running as much as she had in the past.  Recently she has started to notice a vague left upper chest sensation which seems to be exacerbated when she lifts her arms above her head suggesting a possible musculoskeletal etiology.  However this is new.  She is unaware of any definitive exertional precipitation.  She has continued to be on aspirin plus Brilinta 90 mg twice a day and denies bleeding.  She continues to be on methotrexate weekly.  She is on Prozac 10 mg daily.    Since I last saw her in October 17, 2018, in follow-up of her chest sensation, she underwent a nuclear stress test on November 01, 2018.  She was found to have a very small defect of mild severity and to be anterior to apical anterior segment would seem consistent with breast attenuation.  There was no ischemia.  Post stress ejection fraction was 58%.  There were no diagnostic ST segment changes of ischemia.  Subsequently, her chest pain has resolved.  She has felt well.  She presents for reevaluation.  Past Medical History:  Diagnosis Date  . Chest pain   . RA (rheumatoid arthritis) ([place])   .  Transfusion history     Past Surgical History:  Procedure Laterality Date  . CARPAL TUNNEL RELEASE    . CORONARY ANGIOPLASTY     stint  . TENDON RELEASE Right 2006    Current Medications: Outpatient Medications Prior to Visit  Medication Sig Dispense Refill  . aspirin EC 81 MG tablet Take 81 mg by mouth daily.    Marland Kitchen atorvastatin (LIPITOR) 40 MG tablet Take 1 tablet (40 mg total) by mouth daily. Need appointment 90 tablet 0  . CALCIUM PO Take 600 mg by mouth daily.     . Chlorpheniramine Maleate (ALLERGY PO) Take by mouth.    .  Cholecalciferol (VITAMIN D) 2000 UNITS CAPS Take by mouth.    . fish oil-omega-3 fatty acids 1000 MG capsule Take by mouth daily.     Marland Kitchen FLUoxetine (PROZAC) 10 MG capsule Take by mouth daily.  0  . folic acid (FOLVITE) 1 MG tablet Take 1 mg by mouth daily.    . methotrexate (RHEUMATREX) 2.5 MG tablet Take 10 mg by mouth once a week. Caution:Chemotherapy. Protect from light.    . nitroGLYCERIN (NITROSTAT) 0.4 MG SL tablet Place 0.4 mg under the tongue every 5 (five) minutes as needed for chest pain.    . ticagrelor (BRILINTA) 60 MG TABS tablet Take 1 tablet (60 mg total) by mouth 2 (two) times daily. 180 tablet 3   No facility-administered medications prior to visit.      Allergies:   Amoxicillin   Social History   Socioeconomic History  . Marital status: Divorced    Spouse name: Not on file  . Number of children: Not on file  . Years of education: Not on file  . Highest education level: Not on file  Occupational History  . Not on file  Social Needs  . Financial resource strain: Not on file  . Food insecurity:    Worry: Not on file    Inability: Not on file  . Transportation needs:    Medical: Not on file    Non-medical: Not on file  Tobacco Use  . Smoking status: Never Smoker  . Smokeless tobacco: Never Used  Substance and Sexual Activity  . Alcohol use: No  . Drug use: No  . Sexual activity: Not Currently    Birth control/protection: Post-menopausal  Lifestyle  . Physical activity:    Days per week: Not on file    Minutes per session: Not on file  . Stress: Not on file  Relationships  . Social connections:    Talks on phone: Not on file    Gets together: Not on file    Attends religious service: Not on file    Active member of club or organization: Not on file    Attends meetings of clubs or organizations: Not on file    Relationship status: Not on file  Other Topics Concern  . Not on file  Social History Narrative  . Not on file    Additional social history  is notable in that she is divorced for 8 years.  She has 2 children and 2 grandchildren.  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  There is no history of tobacco use or alcohol use.  She exercises at least 5 days per week.  Family History:  The patient's family history includes Breast cancer (age of onset: 69) in her maternal grandmother; Diabetes in her other; Heart disease in her father, mother, and other; Heart failure in  her mother; Lung cancer (age of onset: 25) in her mother.  Her father had suffered an initial MI at age 24. She has 2 sisters who are ages 59 and 13.  ROS General: Negative; No fevers, chills, or night sweats;  HEENT: Negative; No changes in vision or hearing, sinus congestion, difficulty swallowing Pulmonary: Negative; No cough, wheezing, shortness of breath, hemoptysis Cardiovascular: See history of present illness GI: Negative; No nausea, vomiting, diarrhea, or abdominal pain GU: Negative; No dysuria, hematuria, or difficulty voiding Musculoskeletal: Positive for rheumatoid arthritis Hematologic/Oncology: Negative; no easy bruising, bleeding Endocrine: Negative; no heat/cold intolerance; no diabetes Neuro: Negative; no changes in balance, headaches Skin: Negative; No rashes or skin lesions Psychiatric: Negative; No behavioral problems, depression Sleep: Negative; No snoring, daytime sleepiness, hypersomnolence, bruxism, restless legs, hypnogognic hallucinations, no cataplexy Other comprehensive 14 point system review is negative.   PHYSICAL EXAM:   VS:  BP 113/74   Pulse 69   Ht 5' 7" (1.702 m)   Wt 146 lb 6.4 oz (66.4 kg)   LMP 08/10/1998 (Exact Date)   BMI 22.93 kg/m    Repeat blood pressure by me was 112/68  Wt Readings from Last 3 Encounters:  11/29/18 146 lb 6.4 oz (66.4 kg)  11/01/18 142 lb (64.4 kg)  10/18/18 142 lb (64.4 kg)    General: Alert, oriented, no distress.  Skin: normal turgor, no rashes, warm and dry HEENT:  Normocephalic, atraumatic. Pupils equal round and reactive to light; sclera anicteric; extraocular muscles intact;  Nose without nasal septal hypertrophy Mouth/Parynx benign; Mallinpatti scale 2 Neck: No JVD, no carotid bruits; normal carotid upstroke Lungs: clear to ausculatation and percussion; no wheezing or rales Chest wall: without tenderness to palpitation Heart: PMI not displaced, RRR, s1 s2 normal, 1/6 systolic murmur with intermittent systolic click, no diastolic murmur, no rubs, gallops, thrills, or heaves Abdomen: soft, nontender; no hepatosplenomehaly, BS+; abdominal aorta nontender and not dilated by palpation. Back: no CVA tenderness Pulses 2+ Musculoskeletal: full range of motion, normal strength, no joint deformities Extremities: no clubbing cyanosis or edema, Homan's sign negative  Neurologic: grossly nonfocal; Cranial nerves grossly wnl Psychologic: Normal mood and affect  Studies/Labs Reviewed:   October 17, 2018 ECG (independently read by me): Sinus bradycardia 58 bpm.  Normal intervals.  No ectopy.  No significant ST-T abnormalities.  September 14, 2017 ECG (independently read by me): normal sinus rhythm at 72 bpm.  Normal intervals.  Mild RV conduction delay.  No significant ST T abnormalities.  Recent Labs: BMP Latest Ref Rng & Units 09/16/2017 10/08/2014  Glucose 65 - 99 mg/dL 81 82  BUN 8 - 27 mg/dL 18 19  Creatinine 0.57 - 1.00 mg/dL 0.82 0.89  BUN/Creat Ratio 12 - 28 22 -  Sodium 134 - 144 mmol/L 140 141  Potassium 3.5 - 5.2 mmol/L 4.3 4.8  Chloride 96 - 106 mmol/L 101 103  CO2 20 - 29 mmol/L 24 27  Calcium 8.7 - 10.3 mg/dL 9.2 9.7     Hepatic Function Latest Ref Rng & Units 09/16/2017  Total Protein 6.0 - 8.5 g/dL 7.3  Albumin 3.6 - 4.8 g/dL 4.4  AST 0 - 40 IU/L 28  ALT 0 - 32 IU/L 22  Alk Phosphatase 39 - 117 IU/L 88  Total Bilirubin 0.0 - 1.2 mg/dL 0.7    CBC Latest Ref Rng & Units 09/16/2017 10/14/2016 10/14/2016  WBC 3.4 - 10.8 x10E3/uL 4.3  4.1 -  Hemoglobin 11.1 - 15.9 g/dL 13.1 14.1 14.2  Hematocrit  34.0 - 46.6 % 39.0 42.6 -  Platelets 150 - 379 x10E3/uL 239 229 -   Lab Results  Component Value Date   MCV 95 09/16/2017   MCV 98.8 10/14/2016   Lab Results  Component Value Date   TSH 3.170 09/16/2017   No results found for: HGBA1C   BNP No results found for: BNP  ProBNP No results found for: PROBNP   Lipid Panel     Component Value Date/Time   CHOL 126 09/16/2017 0828   TRIG 65 09/16/2017 0828   HDL 60 09/16/2017 0828   CHOLHDL 2.1 09/16/2017 0828   CHOLHDL 3.0 10/14/2016 1410   VLDL 12 10/14/2016 1410   LDLCALC 53 09/16/2017 0828     RADIOLOGY: No results found.   Additional studies/ records that were reviewed today include:  I reviewed the [facility] [place].  I reviewed the patient's noninvasive studies and cardiac catheterization and PCI records.   ASSESSMENT:    1. CAD S/P percutaneous coronary angioplasty   2. Hyperlipidemia with target LDL less than 70   3. Rheumatoid arthritis, involving unspecified site, unspecified rheumatoid factor presence ([place])   4. Atypical chest pain     PLAN:  Ms Aurianna Earlywine is a very pleasant 67 year old female who has a greater than 20 year history of rheumatoid arthritis and  developed new onset exertional chest pressure which occurred while running in 2018.  She was found to have a 99% ostial LAD stenosis as the culprit lesion contributing to her symptomatology and underwent successful DES stenting with insertion of a Resolute Onyx 3.012 mm DES stent on 04/15/2017.  When I saw her for initial evaluation she was without recurrent anginal symptomatology and had resumed physical activity and denied any exertional symptoms.  On her initial stress test she also had experienced an episode of nonsustained VT.  She denies any palpitations.  She is not on any beta-blocker therapy and continues to be bradycardic.  Her physical  exam suggest the possibility of mitral valve prolapse which was not detected on 2D echo Doppler imaging.  When I saw her in October 2019 she had developed somewhat atypical upper left lateral pectoral chest discomfort without any exertional component.  She underwent a follow-up exercise Myoview study which was low risk.  She did not develop any chest pain or diagnostic ECG changes.  Mild thinning was noted in the anterior and apical anterior segment which was felt most likely due to breast attenuation.  Clinically she has not had any recurrent symptomatology.  I have recommended ongoing medical management.  Her blood pressure today is stable.  She is on aspirin and Brilinta following her DES stent with low-dose ticagrelor at 60 mg twice a day.  She is on atorvastatin 40 mg daily for hyperlipidemia.  LDL cholesterol in September 2019 was outstanding at 67.  She is on methotrexate for her rheumatoid arthritis.  She will continue with the current medical regimen.  I will see her in 6 months for reevaluation. .   Time spent: 25 minutes Medication Adjustments/Labs and Tests Ordered: Current medicines are reviewed at length with the patient today.  Concerns regarding medicines are outlined above.  Medication changes, Labs and Tests ordered today are listed in the Patient Instructions below.  Patient Instructions  Medication Instructions:  Your physician recommends that you continue on your current medications as directed. Please refer to the Current Medication list given to you today.  If you need a refill  on your cardiac medications before your next appointment, please call your pharmacy.   Follow-Up: At [facility], you and your health needs are our priority.  As part of our continuing mission to provide you with exceptional heart care, we have created designated Provider Care Teams.  These Care Teams include your primary Cardiologist (physician) and Advanced Practice Providers (APPs -  Physician  Assistants and Nurse Practitioners) who all work together to provide you with the care you need, when you need it. You will need a follow up appointment in 6 months.  Please call our office 2 months in advance to schedule this appointment.  You may see Dr. Claiborne Billings or one of the following Advanced Practice Providers on your designated Care Team: [place], [place] . Fabian Sharp, PA-C        Signed, Shelva Majestic, MD, [facility]  12/01/2018 8:46 PM    [facility] [address], [place], [place], [place]  [address] Phone: [phone]

## 2018-11-29 NOTE — Patient Instructions (Signed)
Medication Instructions:  Your physician recommends that you continue on your current medications as directed. Please refer to the Current Medication list given to you today.  If you need a refill on your cardiac medications before your next appointment, please call your pharmacy.   Follow-Up: At [facility], you and your health needs are our priority.  As part of our continuing mission to provide you with exceptional heart care, we have created designated Provider Care Teams.  These Care Teams include your primary Cardiologist (physician) and Advanced Practice Providers (APPs -  Physician Assistants and Nurse Practitioners) who all work together to provide you with the care you need, when you need it. You will need a follow up appointment in 6 months.  Please call our office 2 months in advance to schedule this appointment.  You may see Dr. Kelly or one of the following Advanced Practice Providers on your designated Care Team: Hao Meng, PA-C . Angela Duke, PA-C  .  

## 2018-12-01 ENCOUNTER — Encounter: Payer: Self-pay | Admitting: Cardiovascular Disease

## 2019-02-23 ENCOUNTER — Telehealth: Payer: Medicare Other | Admitting: Physician Assistant

## 2019-02-23 ENCOUNTER — Other Ambulatory Visit: Payer: Self-pay | Admitting: Cardiovascular Disease

## 2019-02-23 DIAGNOSIS — J208 Acute bronchitis due to other specified organisms: Secondary | ICD-10-CM | POA: Diagnosis not present

## 2019-02-23 DIAGNOSIS — B9689 Other specified bacterial agents as the cause of diseases classified elsewhere: Secondary | ICD-10-CM

## 2019-02-23 MED ORDER — DOXYCYCLINE HYCLATE 100 MG PO CAPS: 100.0000 mg | ORAL_CAPSULE | Freq: Two times a day (BID) | ORAL | 0 refills | Status: DC

## 2019-02-23 MED ORDER — BENZONATATE 100 MG PO CAPS: 100.0000 mg | ORAL_CAPSULE | Freq: Three times a day (TID) | ORAL | 0 refills | Status: DC | PRN

## 2019-02-23 NOTE — Telephone Encounter (Signed)
Rx(s) sent to pharmacy electronically.  

## 2019-02-23 NOTE — Progress Notes (Signed)

## 2019-02-23 NOTE — Progress Notes (Signed)
Have spent 5 minutes in chart review, updating chart, in medical decision making and in responding to patient.   Piedad Climes, PA-C

## 2019-07-21 ENCOUNTER — Other Ambulatory Visit: Payer: Self-pay | Admitting: Cardiovascular Disease

## 2019-08-14 ENCOUNTER — Ambulatory Visit (INDEPENDENT_AMBULATORY_CARE_PROVIDER_SITE_OTHER): Payer: Medicare Other | Admitting: Physician Assistant

## 2019-08-14 ENCOUNTER — Other Ambulatory Visit: Payer: Self-pay

## 2019-08-14 ENCOUNTER — Encounter: Payer: Self-pay | Admitting: Physician Assistant

## 2019-08-14 VITALS — BP 128/70 | HR 72 | Ht 67.0 in | Wt 149.4 lb

## 2019-08-14 DIAGNOSIS — M545 Low back pain, unspecified: Secondary | ICD-10-CM

## 2019-08-14 DIAGNOSIS — E785 Hyperlipidemia, unspecified: Secondary | ICD-10-CM | POA: Diagnosis not present

## 2019-08-14 DIAGNOSIS — I2510 Atherosclerotic heart disease of native coronary artery without angina pectoris: Secondary | ICD-10-CM | POA: Diagnosis not present

## 2019-08-14 NOTE — Progress Notes (Signed)
Cardiology Office Note    Date:  08/16/2019   ID:  Barbara Ayers, Barbara Ayers [birth year], MRN [id number]  PCP:  Barbara Hale, MD  Cardiologist:  Dr. Tresa Ayers   Chief Complaint  Patient presents with  . Follow-up    seen for Dr. Tresa Ayers    History of Present Illness:  Barbara Ayers is a 68 y.o. female with past medical history of rheumatoid arthritis and CAD.  Patient was initially referred for evaluation of vague chest discomfort.  Stress test was equivocal finding for anteroseptal ischemia.  She had a cardiac catheterization in April 2018 at [facility] that showed 99% ostial LAD stenosis treated with 3.0 x 12 mm resolute Onyx DES.  Last Myoview in November 2019 showed a very small defect of mild severity in the anterior to apical anterior segment consistent with breast attenuation, no ischemia, EF 38%.  Patient was seen back by Dr. Tresa Ayers in December 2019 at which time she was feeling well.  Patient presents today for cardiology office visit.  She denies any recent exertional chest pain or shortness of breath.  She has no lower extremity edema, orthopnea or PND.  Her blood pressure is normal at 128/70.  She does have significant back pain, which she attributed to muscle spasm.  She does not have any burning when urinating.  She denies any recent fever, chill or cough.  She has a telehealth visit with her primary care provider at 11 today to discuss muscle spasm.  Overall I think she is doing quite well from cardiology perspective.  Her EKG is unchanged with good rate control.  She will continue on the current medication.  I will defer her annual lipid panel to her primary care provider.   Past Medical History:  Diagnosis Date  . Chest pain   . RA (rheumatoid arthritis) (HCC)   . Transfusion history     Past Surgical History:  Procedure Laterality Date  . CARPAL TUNNEL RELEASE    . CORONARY ANGIOPLASTY     stint  . TENDON RELEASE Right 2006    Current Medications:  Outpatient Medications Prior to Visit  Medication Sig Dispense Refill  . acetaminophen (TYLENOL) 650 MG CR tablet Take by mouth.    Marland Kitchen aspirin EC 81 MG tablet Take 81 mg by mouth daily.    Marland Kitchen atorvastatin (LIPITOR) 40 MG tablet Take 1 tablet (40 mg total) by mouth daily. 90 tablet 3  . BRILINTA 60 MG TABS tablet TAKE 1 TABLET BY MOUTH TWO  TIMES DAILY 180 tablet 3  . CALCIUM PO Take 600 mg by mouth daily.     . Cholecalciferol (VITAMIN D) 2000 UNITS CAPS Take by mouth.    . fish oil-omega-3 fatty acids 1000 MG capsule Take by mouth daily.     Marland Kitchen FLUoxetine (PROZAC) 10 MG capsule Take by mouth daily.  0  . folic acid (FOLVITE) 1 MG tablet Take 1 mg by mouth daily.    Marland Kitchen loratadine (CLARITIN) 10 MG tablet Take by mouth.    . methotrexate (RHEUMATREX) 2.5 MG tablet Take 10 mg by mouth once a week. Caution:Chemotherapy. Protect from light.    . Turmeric (QC TUMERIC COMPLEX PO) Take 1,000 mg by mouth daily.    . benzonatate (TESSALON) 100 MG capsule Take 1 capsule (100 mg total) by mouth 3 (three) times daily as needed for cough. (Patient not taking: Reported on 08/14/2019) 30 capsule 0  . Chlorpheniramine Maleate (ALLERGY PO) Take by mouth.    Marland Kitchen  doxycycline (VIBRAMYCIN) 100 MG capsule Take 1 capsule (100 mg total) by mouth 2 (two) times daily. (Patient not taking: Reported on 08/14/2019) 14 capsule 0  . nitroGLYCERIN (NITROSTAT) 0.4 MG SL tablet Place 0.4 mg under the tongue every 5 (five) minutes as needed for chest pain.     No facility-administered medications prior to visit.      Allergies:   Amoxicillin   Social History   Socioeconomic History  . Marital status: Divorced    Spouse name: Not on file  . Number of children: Not on file  . Years of education: Not on file  . Highest education level: Not on file  Occupational History  . Not on file  Social Needs  . Financial resource strain: Not on file  . Food insecurity    Worry: Not on file    Inability: Not on file  . Transportation  needs    Medical: Not on file    Non-medical: Not on file  Tobacco Use  . Smoking status: Never Smoker  . Smokeless tobacco: Never Used  Substance and Sexual Activity  . Alcohol use: No  . Drug use: No  . Sexual activity: Not Currently    Birth control/protection: Post-menopausal  Lifestyle  . Physical activity    Days per week: Not on file    Minutes per session: Not on file  . Stress: Not on file  Relationships  . Social Herbalist on phone: Not on file    Gets together: Not on file    Attends religious service: Not on file    Active member of club or organization: Not on file    Attends meetings of clubs or organizations: Not on file    Relationship status: Not on file  Other Topics Concern  . Not on file  Social History Narrative  . Not on file     Family History:  The patient's family history includes Breast cancer (age of onset: 41) in her maternal grandmother; Diabetes in an other family member; Heart disease in her father, mother, and another family member; Heart failure in her mother; Lung cancer (age of onset: 68) in her mother.   ROS:   Please see the history of present illness.    ROS All other systems reviewed and are negative.   PHYSICAL EXAM:   VS:  BP 128/70   Pulse 72   Ht 5\' 7"  (1.702 m)   Wt 149 lb 6.4 oz (67.8 kg)   LMP 08/10/1998 (Exact Date)   BMI 23.40 kg/m    GEN: Well nourished, well developed, in no acute distress  HEENT: normal  Neck: no JVD, carotid bruits, or masses Cardiac: RRR; no murmurs, rubs, or gallops,no edema  Respiratory:  clear to auscultation bilaterally, normal work of breathing GI: soft, nontender, nondistended, + BS MS: no deformity or atrophy  Skin: warm and dry, no rash Neuro:  Alert and Oriented x 3, Strength and sensation are intact Psych: euthymic mood, full affect  Wt Readings from Last 3 Encounters:  08/14/19 149 lb 6.4 oz (67.8 kg)  11/29/18 146 lb 6.4 oz (66.4 kg)  11/01/18 142 lb (64.4 kg)       Studies/Labs Reviewed:   EKG:  EKG is ordered today.  The ekg ordered today demonstrates normal sinus rhythm without significant ST-T wave changes  Recent Labs: No results found for requested labs within last 8760 hours.   Lipid Panel    Component Value Date/Time  CHOL 126 09/16/2017 0828   TRIG 65 09/16/2017 0828   HDL 60 09/16/2017 0828   CHOLHDL 2.1 09/16/2017 0828   CHOLHDL 3.0 10/14/2016 1410   VLDL 12 10/14/2016 1410   LDLCALC 53 09/16/2017 0828    Additional studies/ records that were reviewed today include:   Cath 04/15/2017     ASSESSMENT:    1. Coronary artery disease involving native coronary artery of native heart without angina pectoris   2. Hyperlipidemia with target LDL less than 70      PLAN:  In order of problems listed above:  1. CAD: Continue aspirin and Lipitor.  He denies any recent anginal symptoms.  She is on the lower dose of Brilinta 60 mg twice daily.  2. Hyperlipidemia: Continue Lipitor 40 mg daily.  Last lipid panel obtained on 08/31/2018 showed HDL 15 8, LDL 38, total cholesterol 263, triglycerides 61.  Will defer repeat lipid panel to primary care provider  3. Back pain: Location of the pain is central near the sacral area.  Defer to primary care provider.  She has a telehealth visit today around 47.      Medication Adjustments/Labs and Tests Ordered: Current medicines are reviewed at length with the patient today.  Concerns regarding medicines are outlined above.  Medication changes, Labs and Tests ordered today are listed in the Patient Instructions below. Patient Instructions  Medication Instructions:  Your physician recommends that you continue on your current medications as directed. Please refer to the Current Medication list given to you today.  If you need a refill on your cardiac medications before your next appointment, please call your pharmacy.   Lab work: NONE ordered at this time of appointment If you have labs (blood  work) drawn today and your tests are completely normal, you will receive your results only by: Marland Kitchen MyChart Message (if you have MyChart) OR . A paper copy in the mail If you have any lab test that is abnormal or we need to change your treatment, we will call you to review the results.  Testing/Procedures: NONE ordered at this time of appointment   Follow-Up: At [facility], you and your health needs are our priority.  As part of our continuing mission to provide you with exceptional heart care, we have created designated Provider Care Teams.  These Care Teams include your primary Cardiologist (physician) and Advanced Practice Providers (APPs -  Physician Assistants and Nurse Practitioners) who all work together to provide you with the care you need, when you need it. You will need a follow up appointment in 6 months with Nicki Guadalajara, MD   Any Other Special Instructions Will Be Listed Below (If Applicable).       Ramond Dial, [place]  08/16/2019 10:43 AM    [facility] [address] [place], [place], [place]  [address] Phone: [phone]; Fax: [phone]

## 2019-08-14 NOTE — Patient Instructions (Signed)
Medication Instructions:  Your physician recommends that you continue on your current medications as directed. Please refer to the Current Medication list given to you today.  If you need a refill on your cardiac medications before your next appointment, please call your pharmacy.   Lab work: NONE ordered at this time of appointment If you have labs (blood work) drawn today and your tests are completely normal, you will receive your results only by: Marland Kitchen MyChart Message (if you have MyChart) OR . A paper copy in the mail If you have any lab test that is abnormal or we need to change your treatment, we will call you to review the results.  Testing/Procedures: NONE ordered at this time of appointment   Follow-Up: At [facility], you and your health needs are our priority.  As part of our continuing mission to provide you with exceptional heart care, we have created designated Provider Care Teams.  These Care Teams include your primary Cardiologist (physician) and Advanced Practice Providers (APPs -  Physician Assistants and Nurse Practitioners) who all work together to provide you with the care you need, when you need it. You will need a follow up appointment in 6 months with Shelva Majestic, MD   Any Other Special Instructions Will Be Listed Below (If Applicable).

## 2019-08-16 ENCOUNTER — Encounter: Payer: Self-pay | Admitting: Physician Assistant

## 2019-08-16 DIAGNOSIS — G5622 Lesion of ulnar nerve, left upper limb: Secondary | ICD-10-CM | POA: Insufficient documentation

## 2019-08-18 ENCOUNTER — Ambulatory Visit: Payer: Medicare Other | Admitting: Podiatry

## 2019-08-18 ENCOUNTER — Other Ambulatory Visit: Payer: Self-pay

## 2019-08-18 VITALS — Temp 97.5°F

## 2019-08-18 DIAGNOSIS — M2042 Other hammer toe(s) (acquired), left foot: Secondary | ICD-10-CM | POA: Diagnosis not present

## 2019-08-18 DIAGNOSIS — B351 Tinea unguium: Secondary | ICD-10-CM | POA: Diagnosis not present

## 2019-08-18 DIAGNOSIS — L602 Onychogryphosis: Secondary | ICD-10-CM

## 2019-08-18 DIAGNOSIS — M2041 Other hammer toe(s) (acquired), right foot: Secondary | ICD-10-CM | POA: Diagnosis not present

## 2019-08-18 NOTE — Progress Notes (Signed)
Subjective:  Patient ID: Barbara Ayers, female    DOB: 11-18-1951,  MRN: [id number]  Chief Complaint  Patient presents with  . Nail Problem    Pt states nail fungus left 2nd, right 1st and 2nd. Pt states nails occasionally fall off.    68 y.o. female presents with the above complaint. HX as above.  Review of Systems: Negative except as noted in the HPI. Denies N/V/F/Ch.  Past Medical History:  Diagnosis Date  . Chest pain   . RA (rheumatoid arthritis) ([place])   . Transfusion history     Current Outpatient Medications:  .  acetaminophen (TYLENOL) 650 MG CR tablet, Take by mouth., Disp: , Rfl:  .  aspirin EC 81 MG tablet, Take 81 mg by mouth daily., Disp: , Rfl:  .  atorvastatin (LIPITOR) 40 MG tablet, Take 1 tablet (40 mg total) by mouth daily., Disp: 90 tablet, Rfl: 3 .  benzonatate (TESSALON) 100 MG capsule, Take 1 capsule (100 mg total) by mouth 3 (three) times daily as needed for cough., Disp: 30 capsule, Rfl: 0 .  BRILINTA 60 MG TABS tablet, TAKE 1 TABLET BY MOUTH TWO  TIMES DAILY, Disp: 180 tablet, Rfl: 3 .  CALCIUM PO, Take 600 mg by mouth daily. , Disp: , Rfl:  .  Chlorpheniramine Maleate (ALLERGY PO), Take by mouth., Disp: , Rfl:  .  Cholecalciferol (VITAMIN D) 2000 UNITS CAPS, Take by mouth., Disp: , Rfl:  .  fish oil-omega-3 fatty acids 1000 MG capsule, Take by mouth daily. , Disp: , Rfl:  .  FLUoxetine (PROZAC) 10 MG capsule, Take by mouth daily., Disp: , Rfl: 0 .  folic acid (FOLVITE) 1 MG tablet, Take 1 mg by mouth daily., Disp: , Rfl:  .  loratadine (CLARITIN) 10 MG tablet, Take by mouth., Disp: , Rfl:  .  methotrexate (RHEUMATREX) 2.5 MG tablet, Take 10 mg by mouth once a week. Caution:Chemotherapy. Protect from light., Disp: , Rfl:  .  nitroGLYCERIN (NITROSTAT) 0.4 MG SL tablet, Place 0.4 mg under the tongue every 5 (five) minutes as needed for chest pain., Disp: , Rfl:  .  Turmeric (QC TUMERIC COMPLEX PO), Take 1,000 mg by mouth daily., Disp: , Rfl:  .   doxycycline (VIBRAMYCIN) 100 MG capsule, Take 1 capsule (100 mg total) by mouth 2 (two) times daily. (Patient not taking: Reported on 08/18/2019), Disp: 14 capsule, Rfl: 0  Social History   Tobacco Use  Smoking Status Never Smoker  Smokeless Tobacco Never Used    Allergies  Allergen Reactions  . Amoxicillin Nausea And Vomiting    States she has had augmentin without issue   Objective:   Vitals:   08/18/19 0826  Temp: (!) 97.5 F (36.4 C)   There is no height or weight on file to calculate BMI. Constitutional Well developed. Well nourished.  Vascular Dorsalis pedis pulses palpable bilaterally. Posterior tibial pulses palpable bilaterally. Capillary refill normal to all digits.  No cyanosis or clubbing noted. Pedal hair growth normal.  Neurologic Normal speech. Oriented to person, place, and time. Epicritic sensation to light touch grossly present bilaterally.  Dermatologic Nails bilat 2nd thickened No open wounds Distal callus 2nd toes bilat.  Orthopedic: Normal joint ROM without pain or crepitus bilaterally. Hammertoe deformities lesser digits bilat.1 No bony tenderness.   Radiographs: None Assessment:   1. Onychomycosis   2. Onychauxis   3. Hammertoes of both feet    Plan:  Patient was evaluated and treated and all questions answered.  Onychomycosis, onychauxis, hammertoes -Educated on etiology of nail fungus and traumatic changes. -Recommend tolcylen gel -Discussed etiology of hammertoe deformities -Hammertoe crest pads dispensed. -Discussed possible surgical correction in the future if pain persists. -F/u should nail changes not improve.  No follow-ups on file.

## 2019-09-13 ENCOUNTER — Encounter: Payer: Self-pay | Admitting: Certified Nurse Midwife

## 2019-09-18 ENCOUNTER — Telehealth: Payer: Self-pay

## 2019-09-18 NOTE — Telephone Encounter (Signed)
Patient was notified of normal bone density test result

## 2019-10-19 ENCOUNTER — Other Ambulatory Visit: Payer: Self-pay

## 2019-10-23 ENCOUNTER — Other Ambulatory Visit: Payer: Self-pay

## 2019-10-23 ENCOUNTER — Encounter: Payer: Self-pay | Admitting: Certified Nurse Midwife

## 2019-10-23 ENCOUNTER — Ambulatory Visit (INDEPENDENT_AMBULATORY_CARE_PROVIDER_SITE_OTHER): Payer: Medicare Other | Admitting: Certified Nurse Midwife

## 2019-10-23 ENCOUNTER — Other Ambulatory Visit (HOSPITAL_COMMUNITY)
Admission: RE | Admit: 2019-10-23 | Discharge: 2019-10-23 | Disposition: A | Discharge status: Home / Self Care | Payer: Medicare Other | Source: Ambulatory Visit | Attending: Obstetrics & Gynecology | Admitting: Obstetrics & Gynecology

## 2019-10-23 VITALS — BP 110/62 | HR 68 | Temp 97.2°F | Resp 16 | Ht 66.75 in | Wt 146.0 lb

## 2019-10-23 DIAGNOSIS — Z23 Encounter for immunization: Secondary | ICD-10-CM

## 2019-10-23 DIAGNOSIS — Z124 Encounter for screening for malignant neoplasm of cervix: Secondary | ICD-10-CM | POA: Insufficient documentation

## 2019-10-23 DIAGNOSIS — Z0000 Encounter for general adult medical examination without abnormal findings: Secondary | ICD-10-CM | POA: Diagnosis not present

## 2019-10-23 DIAGNOSIS — Z780 Asymptomatic menopausal state: Secondary | ICD-10-CM | POA: Diagnosis not present

## 2019-10-23 DIAGNOSIS — Z01419 Encounter for gynecological examination (general) (routine) without abnormal findings: Secondary | ICD-10-CM

## 2019-10-23 NOTE — Progress Notes (Signed)
68 y.o. G54P2002 Divorced  Caucasian Fe here for annual exam. Post menopausal no vaginal bleeding. Using Olive oil for vaginal dryness. Has had no weight gain. Recent BMD with Rheumatology MD was normal. Continues with Methotrexate use. Has not seen PCP for Hgb A1-C and cholesterol, requests today due her PCP no longer with practice. Staying active and eating well. No other health issues today.  Patient's last menstrual period was 08/10/1998 (exact date).          Sexually active: No.  The current method of family planning is post menopausal status.    Exercising: Yes.    walking, running, stretching Smoker:  no  Review of Systems  Constitutional: Negative.   HENT: Negative.   Eyes: Negative.   Respiratory: Negative.   Cardiovascular: Negative.   Gastrointestinal: Negative.   Genitourinary: Negative.   Musculoskeletal: Negative.   Skin: Negative.   Neurological: Negative.   Endo/Heme/Allergies: Negative.   Psychiatric/Behavioral: Negative.     Health Maintenance: Pap:  10-09-15 neg HPV HR neg, 10-18-18 neg HPV HR neg History of Abnormal Pap: no MMG:  09-13-2019 category d density birads 2:neg Self Breast exams: no Colonoscopy:  2015 polyp f/u 77yrs BMD:   2020 normal TDaP:  2010 Shingles: 2018 Pneumonia: 2018 Hep C and HIV: HIV neg previously, Hep c neg 2017 Labs: yes   reports that she has never smoked. She has never used smokeless tobacco. She reports that she does not drink alcohol or use drugs.  Past Medical History:  Diagnosis Date  . Chest pain   . RA (rheumatoid arthritis) (HCC)   . Transfusion history     Past Surgical History:  Procedure Laterality Date  . CARPAL TUNNEL RELEASE    . CORONARY ANGIOPLASTY     stint  . TENDON RELEASE Right 2006    Current Outpatient Medications  Medication Sig Dispense Refill  . acetaminophen (TYLENOL) 650 MG CR tablet Take by mouth.    Marland Kitchen aspirin EC 81 MG tablet Take 81 mg by mouth daily.    Marland Kitchen atorvastatin (LIPITOR) 40 MG  tablet Take 1 tablet (40 mg total) by mouth daily. 90 tablet 3  . benzonatate (TESSALON) 100 MG capsule Take 1 capsule (100 mg total) by mouth 3 (three) times daily as needed for cough. 30 capsule 0  . BRILINTA 60 MG TABS tablet TAKE 1 TABLET BY MOUTH TWO  TIMES DAILY 180 tablet 3  . CALCIUM PO Take 600 mg by mouth daily.     . Chlorpheniramine Maleate (ALLERGY PO) Take by mouth.    . Cholecalciferol (VITAMIN D) 2000 UNITS CAPS Take by mouth.    . doxycycline (VIBRAMYCIN) 100 MG capsule Take 1 capsule (100 mg total) by mouth 2 (two) times daily. (Patient not taking: Reported on 08/18/2019) 14 capsule 0  . fish oil-omega-3 fatty acids 1000 MG capsule Take by mouth daily.     Marland Kitchen FLUoxetine (PROZAC) 10 MG capsule Take by mouth daily.  0  . folic acid (FOLVITE) 1 MG tablet Take 1 mg by mouth daily.    Marland Kitchen loratadine (CLARITIN) 10 MG tablet Take by mouth.    . methotrexate (RHEUMATREX) 2.5 MG tablet Take 10 mg by mouth once a week. Caution:Chemotherapy. Protect from light.    . nitroGLYCERIN (NITROSTAT) 0.4 MG SL tablet Place 0.4 mg under the tongue every 5 (five) minutes as needed for chest pain.    . Turmeric (QC TUMERIC COMPLEX PO) Take 1,000 mg by mouth daily.     No  current facility-administered medications for this visit.     Family History  Problem Relation Age of Onset  . Heart failure Mother   . Heart disease Mother   . Lung cancer Mother 69       smoker  . Heart disease Father   . Breast cancer Maternal Grandmother 87  . Diabetes Other   . Heart disease Other   . Colon cancer Neg Hx   . Pancreatic cancer Neg Hx   . Stomach cancer Neg Hx   . Rectal cancer Neg Hx     ROS:  Pertinent items are noted in HPI.  Otherwise, a comprehensive ROS was negative.  Exam:   LMP 08/10/1998 (Exact Date)    Ht Readings from Last 3 Encounters:  08/14/19 5\' 7"  (1.702 m)  11/29/18 5\' 7"  (1.702 m)  11/01/18 5\' 7"  (1.702 m)    General appearance: alert, cooperative and appears stated age Head:  Normocephalic, without obvious abnormality, atraumatic Neck: no adenopathy, supple, symmetrical, trachea midline and thyroid normal to inspection and palpation Lungs: clear to auscultation bilaterally Breasts: normal appearance, no masses or tenderness, No nipple retraction or dimpling, No nipple discharge or bleeding, No axillary or supraclavicular adenopathy Heart: regular rate and rhythm Abdomen: soft, non-tender; no masses,  no organomegaly Extremities: extremities normal, atraumatic, no cyanosis or edema Skin: Skin color, texture, turgor normal. No rashes or lesions Lymph nodes: Cervical, supraclavicular, and axillary nodes normal. No abnormal inguinal nodes palpated Neurologic: Grossly normal   Pelvic: External genitalia:  no lesions              Urethra:  normal appearing urethra with no masses, tenderness or lesions              Bartholin's and Skene's: normal                 Vagina: normal appearing vagina with normal color and discharge, no lesions              Cervix: no cervical motion tenderness, no lesions and normal appearance              Pap taken: Yes.   Bimanual Exam:  Uterus:  normal size, contour, position, consistency, mobility, non-tender              Adnexa: normal adnexa and no mass, fullness, tenderness               Rectovaginal: Confirms               Anus:  normal sphincter tone, no lesions  Chaperone present: yes  A:  Well Woman with normal exam  Post menopausal no HRT  Vaginal dryness using Olive oil with good results  RA on Methotrexate with Rheumatology management  PCP no longer with practice request labs today that have not been done  Immunization update  P:   Reviewed health and wellness pertinent to exam  Aware to advise if vaginal bleeding  Continue Olive oil prn.  Continue follow up with MD's regarding other health issues.  Lab: Lipid panel, Hgb A1-C  Request TDAP  Pap smear: yes   counseled on breast self exam, mammography screening,  feminine hygiene, menopause, adequate intake of calcium and vitamin D, diet and exercise  return annually or prn  An After Visit Summary was printed and given to the patient.

## 2019-10-23 NOTE — Addendum Note (Signed)
Addended by: Regina Eck on: 10/23/2019 05:28 PM   Modules accepted: Orders

## 2019-10-23 NOTE — Patient Instructions (Signed)

## 2019-10-24 ENCOUNTER — Ambulatory Visit: Payer: Medicare Other | Admitting: Certified Nurse Midwife

## 2019-10-24 LAB — HEMOGLOBIN A1C
Est. average glucose Bld gHb Est-mCnc: 108 mg/dL
Hgb A1c MFr Bld: 5.4 % (ref 4.8–5.6)

## 2019-10-24 LAB — LIPID PANEL
Chol/HDL Ratio: 2 ratio (ref 0.0–4.4)
Cholesterol, Total: 132 mg/dL (ref 100–199)
HDL: 67 mg/dL (ref >39)
LDL Chol Calc (NIH): 51 mg/dL (ref 0–99)
Triglycerides: 72 mg/dL (ref 0–149)
VLDL Cholesterol Cal: 14 mg/dL (ref 5–40)

## 2019-10-25 LAB — CYTOLOGY - PAP: Diagnosis: NEGATIVE

## 2019-11-09 ENCOUNTER — Ambulatory Visit: Payer: Medicare Other | Admitting: Certified Nurse Midwife

## 2019-11-29 ENCOUNTER — Other Ambulatory Visit: Payer: Self-pay | Admitting: Cardiovascular Disease

## 2019-12-15 ENCOUNTER — Ambulatory Visit: Payer: Medicare Other | Attending: Internal Medicine

## 2019-12-15 DIAGNOSIS — U071 COVID-19: Secondary | ICD-10-CM

## 2019-12-15 DIAGNOSIS — R238 Other skin changes: Secondary | ICD-10-CM

## 2019-12-16 LAB — NOVEL CORONAVIRUS, NAA: SARS-CoV-2, NAA: NOT DETECTED

## 2020-02-02 ENCOUNTER — Ambulatory Visit: Payer: Medicare Other

## 2020-02-08 ENCOUNTER — Telehealth: Payer: Self-pay | Admitting: Cardiovascular Disease

## 2020-02-08 NOTE — Telephone Encounter (Signed)
Left message on VM with pharmacy recommendations per previous message.

## 2020-02-08 NOTE — Telephone Encounter (Signed)
New Message  Patient is calling in to see if it is ok for her to donate blood with her being on atorvastatin and Brilinta. Please give patient a call back to assist. Patient states if she does not answer, please leave a vm letting her know.

## 2020-02-08 NOTE — Telephone Encounter (Signed)
We're fine with it, but Brilinta is on the [employer] denial list. So they won't let her.

## 2020-02-19 ENCOUNTER — Telehealth: Payer: Self-pay | Admitting: Cardiovascular Disease

## 2020-02-19 NOTE — Telephone Encounter (Signed)
Per [employer] webpage :  "Effient (prasugrel) and Brilinta (ticagrelor)- no waiting period for donating whole blood. However you must wait 7 days after taking Brilinta (ticagrelor) before donating platelets by apheresis. You must wait 3 days after taking Effient (prasugrel) before donating platelets by apheresis"   If patient donating whole blood, [facility] will accept donation while on Brilinta only for WHOLE BLOOD.  If patient using another service, they may not allow donation while on Brilinta.

## 2020-02-19 NOTE — Telephone Encounter (Signed)
Pt c/o medication issue:  1. Name of Medication: BRILINTA 60 MG TABS tablet  2. How are you currently taking this medication (dosage and times per day)? Twice a day  3. Are you having a reaction (difficulty breathing--STAT)? no  4. What is your medication issue? Patient would like to know if she can give blood while on this medication.

## 2020-02-19 NOTE — Telephone Encounter (Signed)
LMTCB  Information as noted below sent via MyChart

## 2020-03-19 ENCOUNTER — Encounter: Payer: Self-pay | Admitting: Certified Nurse Midwife

## 2020-04-30 ENCOUNTER — Encounter: Payer: Self-pay | Admitting: Cardiovascular Disease

## 2020-04-30 ENCOUNTER — Ambulatory Visit (INDEPENDENT_AMBULATORY_CARE_PROVIDER_SITE_OTHER): Payer: Medicare Other | Admitting: Cardiovascular Disease

## 2020-04-30 ENCOUNTER — Other Ambulatory Visit: Payer: Self-pay

## 2020-04-30 VITALS — BP 118/68 | HR 65 | Temp 97.2°F | Ht 67.0 in | Wt 149.0 lb

## 2020-04-30 DIAGNOSIS — Z8249 Family history of ischemic heart disease and other diseases of the circulatory system: Secondary | ICD-10-CM

## 2020-04-30 DIAGNOSIS — Z9861 Coronary angioplasty status: Secondary | ICD-10-CM

## 2020-04-30 DIAGNOSIS — I2510 Atherosclerotic heart disease of native coronary artery without angina pectoris: Secondary | ICD-10-CM | POA: Diagnosis not present

## 2020-04-30 DIAGNOSIS — M545 Low back pain: Secondary | ICD-10-CM

## 2020-04-30 DIAGNOSIS — E785 Hyperlipidemia, unspecified: Secondary | ICD-10-CM

## 2020-04-30 DIAGNOSIS — M069 Rheumatoid arthritis, unspecified: Secondary | ICD-10-CM

## 2020-04-30 NOTE — Progress Notes (Signed)
Cardiology Office Note    Date:  05/02/2020   ID:  Barbara Ayers, Barbara Ayers 1951/01/13, MRN [id number]  PCP:  Glenis Smoker, MD  Cardiologist:  Shelva Majestic, MD   F/u cardiology evaluation: Referred by [name] at [facility]  History of Present Illness:  Barbara Ayers is a 69 y.o. female who I saw on September 14, 2017 through the referral of  [name] to establish cardiology care.  I last saw her in December 2019.  She presents for an 40-monthfollow-up evaluation with me. Barbara. MKennardis an active female who has a history of rheumatoid arthritis for approximately 20 years.  She has routinely exercised.  In 2018 she began to notice exertional symptoms of vague chest tightness when running.  She is a personal friend of Dr. [place]dette [place]  She underwent an echo Doppler study which showed an EF of 55% with normal diastolic function.  There was trace to mild aortic insufficiency, mild MR and TR.  She was referred for a nuclear stress test which revealed equivocal findings for anteroseptal ischemia, although she did experience chest pain and had ischemic ECG abnormalities and probable rate related bundle branch block and a brief episode of nonsustained VT.  Her chest pain required approximately 55 minutes for complete resolution in recovery.  [facility] showed a 99% ostial stenosis of her LAD which was successfully stented with a 3.012 mm Resolute onyx DES stent postdilated to 3.25 mm.  She was also found to have mild 20% ostial left main narrowing, 40% mid LAD stenosis and 20% mid RCA stenosis.  Following her intervention, she has been pain-free.  She is back exercising and denies any exertional symptomatology.  She has seen Dr. BMathis Budin follow-up.  [place]  BWendi Snipesto me to continue her cardiology care.  When I saw her for initial evaluation in September 2018 she was doing well.  She specifically denies recurrent anginal symptoms, palpitations, presyncope or syncope.  She is followed by Dr. GInda Merlinfor primary care.    Over the past year she has remained active.  She does yoga several times per week and is not been running as much as she had in the past.  Recently she has started to notice a vague left upper chest sensation which seems to be exacerbated when she lifts her arms above her head suggesting a possible musculoskeletal etiology.  However this is new.  She is unaware of any definitive exertional precipitation.  She has continued to be on aspirin plus Brilinta 90 mg twice a day and denies bleeding.  She continues to be on methotrexate weekly.  She is on Prozac 10 mg daily.    Since I last saw her in October 17, 2018, in follow-up of her chest sensation, she underwent a nuclear stress test on November 01, 2018.  She was found to have a very small defect of mild severity and to be anterior to apical anterior segment would seem consistent with breast attenuation.  There was no ischemia.  Post stress ejection fraction was 58%.  There were no diagnostic ST segment changes of ischemia.    She was seen in follow-up in December 2019 at which time her chest pain had resolved.  I reviewed her nuclear images with  her in detail.    She was seen in August 2020 by Loma Sousa, PA and continue to do well.  She was having occasional back discomfort attributed to muscle spasm.  Over the past year, she has remained stable.  She is followed by Dr. Dossie Der for her rheumatoid arthritis.  She denies any recurrent anginal symptoms or shortness of breath.  She is concerned about an abdominal aortic aneurysm since 3 family members had developed this.  She continues to be on atorvastatin 40 mg for hyperlipidemia with target LDL less than 70.  She is on Enbrel for rheumatoid  arthritis in addition to methotrexate.  She continues to be on aspirin and low-dose Brilinta at 60 mg twice a day.  She presents for evaluation.  Past Medical History:  Diagnosis Date  . Chest pain   . Pinched nerve in shoulder, left   . RA (rheumatoid arthritis) ([place])   . Transfusion history     Past Surgical History:  Procedure Laterality Date  . CARPAL TUNNEL RELEASE    . CORONARY ANGIOPLASTY     stint  . TENDON RELEASE Right 2006    Current Medications: Outpatient Medications Prior to Visit  Medication Sig Dispense Refill  . aspirin EC 81 MG tablet Take 81 mg by mouth daily.    Marland Kitchen CALCIUM PO Take 600 mg by mouth daily.     . Chlorpheniramine Maleate (ALLERGY PO) Take by mouth.    . Cholecalciferol (VITAMIN D) 2000 UNITS CAPS Take by mouth.    [name] 50 MG/ML injection Inject into the skin once a week.    Marland Kitchen FLUoxetine (PROZAC) 10 MG capsule Take by mouth daily.  0  . folic acid (FOLVITE) 1 MG tablet Take 1 mg by mouth daily.    . methotrexate (RHEUMATREX) 2.5 MG tablet Take 10 mg by mouth once a week. Caution:Chemotherapy. Protect from light.    . nitroGLYCERIN (NITROSTAT) 0.4 MG SL tablet Place 0.4 mg under the tongue every 5 (five) minutes as needed for chest pain.    . Omega-3 Fatty Acids (FISH OIL) 1000 MG CAPS Take by mouth.    . Turmeric (QC TUMERIC COMPLEX PO) Take 1,000 mg by mouth daily.    Marland Kitchen UNABLE TO FIND supplement    . atorvastatin (LIPITOR) 40 MG tablet TAKE 1 TABLET BY MOUTH  DAILY 90 tablet 3  . BRILINTA 60 MG TABS tablet TAKE 1 TABLET BY MOUTH TWO  TIMES DAILY 180 tablet 3  . fish oil-omega-3 fatty acids 1000 MG capsule Take by mouth daily.      No facility-administered medications prior to visit.     Allergies:   Amoxicillin   Social History   Socioeconomic History  . Marital status: Divorced    Spouse name: Not on file  . Number of children: Not on file  . Years of education: Not on file  . Highest education level: Not on file   Occupational History  . Not on file  Tobacco Use  . Smoking status: Never Smoker  . Smokeless tobacco: Never Used  Substance and Sexual Activity  . Alcohol use: No  . Drug use: No  . Sexual activity: Not Currently    Birth control/protection: Post-menopausal  Other Topics Concern  . Not on file  Social History Narrative  . Not on file   Social Determinants of Health   Financial Resource Strain:   . Difficulty of Paying Living Expenses:   Food Insecurity:   . Worried About  Running Out of Food in the Last Year:   . [place] in the Last Year:   Transportation Needs:   . Lack of Transportation (Medical):   Marland Kitchen Lack of Transportation (Non-Medical):   Physical Activity:   . Days of Exercise per Week:   . Minutes of Exercise per Session:   Stress:   . Feeling of Stress :   Social Connections:   . Frequency of Communication with Friends and Family:   . Frequency of Social Gatherings with Friends and Family:   . Attends Religious Services:   . Active Member of Clubs or Organizations:   . Attends Archivist Meetings:   Marland Kitchen Marital Status:     Additional social history is notable in that she is divorced for 8 years.  She has 2 children and 2 grandchildren.  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  There is no history of tobacco use or alcohol use.  She exercises at least 5 days per week.  Family History:  The patient's family history includes Breast cancer (age of onset: 34) in her maternal grandmother; Diabetes in an other family member; Heart disease in her father, mother, and another family member; Heart failure in her mother; Lung cancer (age of onset: 64) in her mother.  Her father had suffered an initial MI at age 5. She has 2 sisters who are ages 52 and 90.  ROS General: Negative; No fevers, chills, or night sweats;  HEENT: Negative; No changes in vision or hearing, sinus congestion, difficulty swallowing Pulmonary: Negative; No  cough, wheezing, shortness of breath, hemoptysis Cardiovascular: See history of present illness GI: Negative; No nausea, vomiting, diarrhea, or abdominal pain GU: Negative; No dysuria, hematuria, or difficulty voiding Musculoskeletal: Positive for rheumatoid arthritis Hematologic/Oncology: Negative; no easy bruising, bleeding Endocrine: Negative; no heat/cold intolerance; no diabetes Neuro: Negative; no changes in balance, headaches Skin: Negative; No rashes or skin lesions Psychiatric: Negative; No behavioral problems, depression Sleep: Negative; No snoring, daytime sleepiness, hypersomnolence, bruxism, restless legs, hypnogognic hallucinations, no cataplexy Other comprehensive 14 point system review is negative.   PHYSICAL EXAM:   VS:  BP 118/68   Pulse 65   Temp (!) 97.2 F (36.2 C)   Ht '5\' 7"'$  (1.702 m)   Wt 149 lb (67.6 kg)   LMP 08/10/1998 (Exact Date)   SpO2 96%   BMI 23.34 kg/m     Repeat blood pressure by me was 110/64  Wt Readings from Last 3 Encounters:  04/30/20 149 lb (67.6 kg)  10/23/19 146 lb (66.2 kg)  08/14/19 149 lb 6.4 oz (67.8 kg)    General: Alert, oriented, no distress.  Skin: normal turgor, no rashes, warm and dry HEENT: Normocephalic, atraumatic. Pupils equal round and reactive to light; sclera anicteric; extraocular muscles intact; Nose without nasal septal hypertrophy Mouth/Parynx benign; Mallinpatti scale 2 Neck: No JVD, no carotid bruits; normal carotid upstroke Lungs: clear to ausculatation and percussion; no wheezing or rales Chest wall: without tenderness to palpitation Heart: PMI not displaced, RRR, s1 s2 normal, 1/6 systolic murmur, no diastolic murmur, no rubs, gallops, thrills, or heaves Abdomen: soft, nontender; no hepatosplenomehaly, BS+; abdominal aorta nontender and not dilated by palpation. Back: no CVA tenderness Pulses 2+ Musculoskeletal: full range of motion, normal strength, no joint deformities Extremities: no clubbing cyanosis  or edema, Homan's sign negative  Neurologic: grossly nonfocal; Cranial nerves grossly wnl Psychologic: Normal mood and affect    Studies/Labs Reviewed:   ECG (  independently read by me): Normal sinus rhythm at 65 bpm.  No ectopy.  Normal intervals.  No ST segment changes.  October 17, 2018 ECG (independently read by me): Sinus bradycardia 58 bpm.  Normal intervals.  No ectopy.  No significant ST-T abnormalities.  September 14, 2017 ECG (independently read by me): normal sinus rhythm at 72 bpm.  Normal intervals.  Mild RV conduction delay.  No significant ST T abnormalities.  Recent Labs: BMP Latest Ref Rng & Units 09/16/2017 10/08/2014  Glucose 65 - 99 mg/dL 81 82  BUN 8 - 27 mg/dL 18 19  Creatinine 0.57 - 1.00 mg/dL 0.82 0.89  BUN/Creat Ratio 12 - 28 22 -  Sodium 134 - 144 mmol/L 140 141  Potassium 3.5 - 5.2 mmol/L 4.3 4.8  Chloride 96 - 106 mmol/L 101 103  CO2 20 - 29 mmol/L 24 27  Calcium 8.7 - 10.3 mg/dL 9.2 9.7     Hepatic Function Latest Ref Rng & Units 09/16/2017  Total Protein 6.0 - 8.5 g/dL 7.3  Albumin 3.6 - 4.8 g/dL 4.4  AST 0 - 40 IU/L 28  ALT 0 - 32 IU/L 22  Alk Phosphatase 39 - 117 IU/L 88  Total Bilirubin 0.0 - 1.2 mg/dL 0.7    CBC Latest Ref Rng & Units 09/16/2017 10/14/2016 10/14/2016  WBC 3.4 - 10.8 x10E3/uL 4.3 4.1 -  Hemoglobin 11.1 - 15.9 g/dL 13.1 14.1 14.2  Hematocrit 34.0 - 46.6 % 39.0 42.6 -  Platelets 150 - 379 x10E3/uL 239 229 -   Lab Results  Component Value Date   MCV 95 09/16/2017   MCV 98.8 10/14/2016   Lab Results  Component Value Date   TSH 3.170 09/16/2017   Lab Results  Component Value Date   HGBA1C 5.4 10/23/2019     BNP No results found for: BNP  ProBNP No results found for: PROBNP   Lipid Panel     Component Value Date/Time   CHOL 132 10/23/2019 1433   TRIG 72 10/23/2019 1433   HDL 67 10/23/2019 1433   CHOLHDL 2.0 10/23/2019 1433   CHOLHDL 3.0 10/14/2016 1410   VLDL 12 10/14/2016 1410   LDLCALC 51 10/23/2019  1433     RADIOLOGY: No results found.   Additional studies/ records that were reviewed today include:  I reviewed the [facility] [place].  I reviewed the patient's noninvasive studies and cardiac catheterization and PCI records.   ASSESSMENT:    1. Coronary artery disease involving native coronary artery of native heart without angina pectoris   2. CAD S/P percutaneous coronary angioplasty   3. Hyperlipidemia with target LDL less than 70   4. Rheumatoid arthritis, involving unspecified site, unspecified whether rheumatoid factor present ([place])   5. Family history of abdominal aortic aneurysm     PLAN:  Barbara Ayers is a very pleasant 69 year old female who has a greater than 20 year history of rheumatoid arthritis and developed new onset exertional chest pressure which occurred while running in 2018.  She was found to have a 99% ostial LAD stenosis as the culprit lesion contributing to her symptomatology and underwent successful DES stenting with insertion of a Resolute Onyx 3.012 mm DES stent on 04/15/2017.  When I saw her for initial evaluation she was without recurrent anginal symptomatology and had resumed physical activity and denied any exertional symptoms.  On her initial stress test she also had experienced an episode of nonsustained VT.  She denies any  palpitations.  She is not on any beta-blocker therapy and continues to be bradycardic.  Her physical exam suggest the possibility of mitral valve prolapse which was not detected on 2D echo Doppler imaging.  When I saw her in October 2019 she had developed somewhat atypical upper left lateral pectoral chest discomfort without any exertional component.  She underwent a follow-up exercise Myoview study which was low risk.  She did not develop any chest pain or diagnostic ECG changes.  Mild thinning was noted in the anterior and apical anterior segment which was felt most likely due to breast  attenuation.  Since her last evaluation, she has continued to do well.  She is not having any recurrent anginal symptomatology.  Her blood pressure today is stable and on repeat by me 110/64.  She has been maintained on aspirin and low-dose Brilinta 60 mg twice a day and is tolerating this well.  She continues to be on room at methotrexate and Enbrel for her rheumatoid arthritis.  On exam her abdominal aorta appears normal dimension.  She is concerned about the possibility of an abdominal aortic aneurysm since 3 members of her family have had significant aneurysms.  She is Medicare.  I discussed with her Medicare allows screening for AAA and I will schedule her to undergo this assessment.  She has undergone laboratory in October 2020 which I reviewed.  Hemoglobin A1c was 5.4.  LDL was excellent at 51 with total cholesterol 132, regimen of atorvastatin.  I will notify her regarding her abdominal aortic ultrasound when complete.  I will see her in 1 year for reevaluation.   Medication Adjustments/Labs and Tests Ordered: Current medicines are reviewed at length with the patient today.  Concerns regarding medicines are outlined above.  Medication changes, Labs and Tests ordered today are listed in the Patient Instructions below.  Patient Instructions  Medication Instructions:  CONTINUE WITH CURRENT MEDICATIONS. NO CHANGES.  *If you need a refill on your cardiac medications before your next appointment, please call your pharmacy*   Testing/Procedures: Your physician has requested that you have an abdominal aorta duplex. During this test, an ultrasound is used to evaluate the aorta. Allow 30 minutes for this exam. Do not eat after midnight the day before and avoid carbonated beverages    Follow-Up: At [facility], you and your health needs are our priority.  As part of our continuing mission to provide you with exceptional heart care, we have created designated Provider Care Teams.  These Care Teams  include your primary Cardiologist (physician) and Advanced Practice Providers (APPs -  Physician Assistants and Nurse Practitioners) who all work together to provide you with the care you need, when you need it.  We recommend signing up for the patient portal called "MyChart".  Sign up information is provided on this After Visit Summary.  MyChart is used to connect with patients for Virtual Visits (Telemedicine).  Patients are able to view lab/test results, encounter notes, upcoming appointments, etc.  Non-urgent messages can be sent to your provider as well.   To learn more about what you can do with MyChart, go to [email or web address].    Your next appointment:   12 month(s)  The format for your next appointment:   In Person  Provider:   Shelva Majestic MD      Signed, Shelva Majestic, MD, [facility]  05/02/2020 [place] [address] [place], [place], [place]  [id number] Phone: [phone]

## 2020-04-30 NOTE — Patient Instructions (Addendum)
Medication Instructions:  CONTINUE WITH CURRENT MEDICATIONS. NO CHANGES.  *If you need a refill on your cardiac medications before your next appointment, please call your pharmacy*   Testing/Procedures: Your physician has requested that you have an abdominal aorta duplex. During this test, an ultrasound is used to evaluate the aorta. Allow 30 minutes for this exam. Do not eat after midnight the day before and avoid carbonated beverages    Follow-Up: At [facility], you and your health needs are our priority.  As part of our continuing mission to provide you with exceptional heart care, we have created designated Provider Care Teams.  These Care Teams include your primary Cardiologist (physician) and Advanced Practice Providers (APPs -  Physician Assistants and Nurse Practitioners) who all work together to provide you with the care you need, when you need it.  We recommend signing up for the patient portal called "MyChart".  Sign up information is provided on this After Visit Summary.  MyChart is used to connect with patients for Virtual Visits (Telemedicine).  Patients are able to view lab/test results, encounter notes, upcoming appointments, etc.  Non-urgent messages can be sent to your provider as well.   To learn more about what you can do with MyChart, go to [email or web address].    Your next appointment:   12 month(s)  The format for your next appointment:   In Person  Provider:   Nicki Guadalajara MD

## 2020-05-01 ENCOUNTER — Other Ambulatory Visit: Payer: Self-pay

## 2020-05-01 MED ORDER — ATORVASTATIN CALCIUM 40 MG PO TABS: 40.0000 mg | ORAL_TABLET | Freq: Every day | ORAL | 3 refills | Status: DC

## 2020-05-01 MED ORDER — TICAGRELOR 60 MG PO TABS: 60.0000 mg | ORAL_TABLET | Freq: Two times a day (BID) | ORAL | 3 refills | Status: DC

## 2020-05-02 ENCOUNTER — Encounter: Payer: Self-pay | Admitting: Cardiovascular Disease

## 2020-05-13 ENCOUNTER — Other Ambulatory Visit: Payer: Self-pay

## 2020-05-13 ENCOUNTER — Ambulatory Visit (HOSPITAL_COMMUNITY)
Admission: RE | Admit: 2020-05-13 | Discharge: 2020-05-13 | Disposition: A | Discharge status: Home / Self Care | Payer: Medicare Other | Source: Ambulatory Visit | Attending: Cardiovascular Disease | Admitting: Cardiovascular Disease

## 2020-05-13 DIAGNOSIS — E785 Hyperlipidemia, unspecified: Secondary | ICD-10-CM

## 2020-05-13 DIAGNOSIS — Z136 Encounter for screening for cardiovascular disorders: Secondary | ICD-10-CM | POA: Diagnosis not present

## 2020-05-13 DIAGNOSIS — Z8489 Family history of other specified conditions: Secondary | ICD-10-CM | POA: Diagnosis not present

## 2020-05-13 DIAGNOSIS — I2510 Atherosclerotic heart disease of native coronary artery without angina pectoris: Secondary | ICD-10-CM

## 2020-08-12 ENCOUNTER — Ambulatory Visit: Payer: Medicare Other | Admitting: Neurology

## 2020-08-13 ENCOUNTER — Telehealth: Payer: Self-pay | Admitting: Physician Assistant

## 2020-08-13 NOTE — Telephone Encounter (Signed)
Patient calling to request that [facility] send in a RX for Tretinoin to [facility] on [address]. She has not been seen since 12/2017. I offered to schedule follow up appointment but she would like to know if she can get refill without being seen. Chart # 2070 in message stack.

## 2020-08-19 NOTE — Telephone Encounter (Signed)
Only seen once and nothing in chart about tretinoin. Needs visit.

## 2020-09-24 ENCOUNTER — Encounter: Payer: Self-pay | Admitting: Neurology

## 2020-09-24 ENCOUNTER — Ambulatory Visit (INDEPENDENT_AMBULATORY_CARE_PROVIDER_SITE_OTHER): Payer: Medicare Other | Admitting: Neurology

## 2020-09-24 ENCOUNTER — Other Ambulatory Visit: Payer: Self-pay

## 2020-09-24 VITALS — BP 116/70 | HR 75 | Ht 67.0 in | Wt 148.0 lb

## 2020-09-24 DIAGNOSIS — R251 Tremor, unspecified: Secondary | ICD-10-CM

## 2020-09-24 DIAGNOSIS — Z9861 Coronary angioplasty status: Secondary | ICD-10-CM

## 2020-09-24 DIAGNOSIS — I2510 Atherosclerotic heart disease of native coronary artery without angina pectoris: Secondary | ICD-10-CM | POA: Diagnosis not present

## 2020-09-24 NOTE — Progress Notes (Signed)
Subjective:    Patient ID: Barbara Ayers is a 69 y.o. female.  HPI     Huston Foley, MD, PhD [facility] [address] [address] [place], [place] [address]  Dear Dr. Chanetta Marshall,  I saw your patient, Barbara Ayers, upon your kind request in my neurologic clinic today for initial consultation of her tremor.  The patient is unaccompanied today.  As you know, Ms. Barbara Ayers is a 69 year old right-handed woman with an underlying medical history of rheumatoid arthritis, anxiety, depression, hyperlipidemia, coronary artery disease with status post coronary stent placement, history of PE, carpal tunnel syndrome with bilateral surgeries,, and hearing loss, who reports an approximately 3-year history of intermittent hand tremor affecting her right hand only or primarily.  Tremor has progressed just a little bit, is not necessarily disturbing her, mostly noticeable when she tries to feed herself.  She has not noticed any difficulty with fine motor skills or stiffness or tremor in her legs or feet.  She has no family history of tremor.  She has 2 sisters.  She has no family history of Parkinson's disease.  She has not fallen but sometimes feels a little off balance, denies any lightheadedness, denies vertigo spells.  She tries to exercise on a regular basis in the form of walking, stationary bike, and stretching.  She does not drink caffeine on a daily basis with the exception of 1 serving of green tea, no alcohol, non-smoker.  She has had bursitis affecting both hips, had an injection into the left hip about a year ago.  Her tremor fluctuates, she has not noticed any connection to any obvious triggers.  I reviewed your office note from 06/12/2020.  She had blood work through your office at the time including A1c which was 5.9, BMP was unremarkable.  Her Past Medical History Is Significant For: Past Medical History:  Diagnosis Date  . Chest pain   . Pinched nerve in shoulder,  left   . RA (rheumatoid arthritis) (HCC)   . Transfusion history     Her Past Surgical History Is Significant For: Past Surgical History:  Procedure Laterality Date  . CARPAL TUNNEL RELEASE    . CORONARY ANGIOPLASTY     stint  . TENDON RELEASE Right 2006    Her Family History Is Significant For: Family History  Problem Relation Age of Onset  . Heart failure Mother   . Heart disease Mother   . Lung cancer Mother 18       smoker  . Heart disease Father   . Breast cancer Maternal Grandmother 50  . Diabetes Other   . Heart disease Other   . Colon cancer Neg Hx   . Pancreatic cancer Neg Hx   . Stomach cancer Neg Hx   . Rectal cancer Neg Hx     Her Social History Is Significant For: Social History   Socioeconomic History  . Marital status: Divorced    Spouse name: Not on file  . Number of children: Not on file  . Years of education: Not on file  . Highest education level: Not on file  Occupational History  . Not on file  Tobacco Use  . Smoking status: Never Smoker  . Smokeless tobacco: Never Used  Substance and Sexual Activity  . Alcohol use: No  . Drug use: No  . Sexual activity: Not Currently    Birth control/protection: Post-menopausal  Other Topics Concern  . Not on file  Social History Narrative  .  Not on file   Social Determinants of Health   Financial Resource Strain:   . Difficulty of Paying Living Expenses: Not on file  Food Insecurity:   . Worried About Programme researcher, broadcasting/film/video in the Last Year: Not on file  . Ran Out of Food in the Last Year: Not on file  Transportation Needs:   . Lack of Transportation (Medical): Not on file  . Lack of Transportation (Non-Medical): Not on file  Physical Activity:   . Days of Exercise per Week: Not on file  . Minutes of Exercise per Session: Not on file  Stress:   . Feeling of Stress : Not on file  Social Connections:   . Frequency of Communication with Friends and Family: Not on file  . Frequency of Social  Gatherings with Friends and Family: Not on file  . Attends Religious Services: Not on file  . Active Member of Clubs or Organizations: Not on file  . Attends Banker Meetings: Not on file  . Marital Status: Not on file    Her Allergies Are:  Allergies  Allergen Reactions  . Amoxicillin Nausea And Vomiting    States she has had augmentin without issue  :   Her Current Medications Are:  Outpatient Encounter Medications as of 09/24/2020  Medication Sig  . aspirin EC 81 MG tablet Take 81 mg by mouth daily.  Marland Kitchen atorvastatin (LIPITOR) 40 MG tablet Take 1 tablet (40 mg total) by mouth daily.  Marland Kitchen CALCIUM PO Take 600 mg by mouth daily.   Elgie Collard SURECLICK 50 MG/ML injection Inject into the skin once a week.  Marland Kitchen FLUoxetine (PROZAC) 10 MG capsule Take by mouth daily.  . folic acid (FOLVITE) 1 MG tablet Take 1 mg by mouth daily.  . methotrexate (RHEUMATREX) 2.5 MG tablet Take 12.5 mg by mouth once a week. Caution:Chemotherapy. Protect from light.   . nitroGLYCERIN (NITROSTAT) 0.4 MG SL tablet Place 0.4 mg under the tongue every 5 (five) minutes as needed for chest pain.  . Omega-3 Fatty Acids (FISH OIL) 1000 MG CAPS Take 1,200 mg by mouth.   . ticagrelor (BRILINTA) 60 MG TABS tablet Take 1 tablet (60 mg total) by mouth 2 (two) times daily.  . Turmeric (QC TUMERIC COMPLEX PO) Take 1,000 mg by mouth daily.  Marland Kitchen UNABLE TO FIND supplement 8 greens  . VITAMIN D PO Take 1,000 Units by mouth.  . [DISCONTINUED] Chlorpheniramine Maleate (ALLERGY PO) Take by mouth. (Patient not taking: Reported on 09/24/2020)  . [DISCONTINUED] Cholecalciferol (VITAMIN D) 2000 UNITS CAPS Take by mouth. (Patient not taking: Reported on 09/24/2020)   No facility-administered encounter medications on file as of 09/24/2020.  :   Review of Systems:  Out of a complete 14 point review of systems, all are reviewed and negative with the exception of these symptoms as listed below:  Review of Systems  Neurological:        Here to discuss worsening right hand tremor. She reports sx have been present for several years now. She also reports a decrease in balance. Several tripping episodes no serious injuries.     Objective:  Neurological Exam  Physical Exam Physical Examination:   Vitals:   09/24/20 0951  BP: 116/70  Pulse: 75  SpO2: 98%    General Examination: The patient is a very pleasant 69 y.o. female in no acute distress. She appears well-developed and well-nourished and well groomed.   HEENT: Normocephalic, atraumatic, pupils are equal, round and reactive  to light , corrective eyeglasses in place, extraocular tracking well preserved, no nystagmus noted, hearing grossly intact.  Face is symmetric, normal facial animation and normal facial sensation to light touch, temperature and vibration.  Speech is clear, no hypophonia or dysarthria or voice tremor, no lip, neck or jaw tremor.  Neck is supple, no rigidity, no carotid bruits.  Airway examination is benign, Mallampati class I, tongue protrudes centrally in palate elevates symmetrically.  No tongue tremor or oral pharyngeal or lingual dyskinesias.    Chest: Clear to auscultation without wheezing, rhonchi or crackles noted.  Heart: S1+S2+0, regular and normal without murmurs, rubs or gallops noted.   Abdomen: Soft, non-tender and non-distended with normal bowel sounds appreciated on auscultation.  Extremities: There is no pitting edema in the distal lower extremities bilaterally. Pedal pulses are intact.  Skin: Warm and dry without trophic changes noted. There are no varicose veins.  Musculoskeletal: exam reveals no obvious joint deformities, tenderness or joint swelling or erythema.   Neurologically:  Mental status: The patient is awake, alert and oriented in all 4 spheres. Her immediate and remote memory, attention, language skills and fund of knowledge are appropriate. There is no evidence of aphasia, agnosia, apraxia or anomia. Speech is clear  with normal prosody and enunciation. Thought process is linear. Mood is normal and affect is normal.  Cranial nerves II - XII are as described above under HEENT exam. In addition: shoulder shrug is normal with equal shoulder height noted. Motor exam: Normal bulk, strength and tone is noted. There is no drift, resting tremor or rebound.  On 09/24/2020: Please well-groomed she has no significant trembling with the right hand, mild insecurity noted with the left hand but no significant or consistent trembling with the nondominant hand. Handwriting with the right hand is legible, not particularly tremulous or micrographic. She has a no noticeable postural or action tremor in the upper extremities, no lower extremity tremor, no intention tremor. Romberg is negative. Reflexes are 2+ throughout. Babinski: Toes are flexor bilaterally. Fine motor skills and coordination: intact with normal finger taps, normal hand movements, normal rapid alternating patting, normal foot taps and normal foot agility.  Cerebellar testing: No dysmetria or intention tremor on finger to nose testing. There is no truncal or gait ataxia.  Sensory exam: intact to light touch, vibration, temperature sense in the upper and lower extremities.  Gait, station and balance: She stands easily. No veering to one side is noted. No leaning to one side is noted. Posture is age-appropriate and stance is narrow based. Gait shows normal stride length and normal pace. No problems turning are noted.  Tandem walk is slightly challenging in the beginning but good after looking straight ahead rather than looking down while trying.  She has preserved arm swing, no shuffling.           Assessment and Plan:   In summary, ZERINA GUIDER is a very pleasant 69 y.o.-year old female with an underlying medical history of rheumatoid arthritis, anxiety, depression, hyperlipidemia, coronary artery disease with status post coronary stent placement, history of PE, carpal  tunnel syndrome with bilateral surgeries,, and hearing loss, who presents for evaluation of her tremor.  She reports an approximately 3-year history of intermittent right hand tremor.  On examination, she has no significant tremor, no evidence of parkinsonism and is largely reassured today.  We talked about tremor triggers.  I did not suggest any symptomatic treatment for trauma as her presentation is mild and she is not  particularly bothered by the tremor but wanted to get it checked out.  I do not see a pressing reason to pursue any additional diagnostic testing from my end of things or imaging studies in particular.  She is advised that we can monitor her symptoms and examination and I would be happy to reevaluate her in 1 year, sooner if needed.  I answered all of her questions today and she was in agreement with this approach.  Thank you very much for allowing me to participate in the care of this nice patient. If I can be of any further assistance to you please do not hesitate to call me at [phone].  Sincerely,   Huston Foley, MD, PhD

## 2020-09-24 NOTE — Patient Instructions (Signed)
You have an intermittent tremor of your right hand. It is currently not noticeable.  I do not see any signs or symptoms of parkinson's like disease or what we call parkinsonism.   For your tremor, I would not recommend any new medication for fear of side effects, especially in light of the intermittent nature of the tremor.   We can monitor your tremor, I would be happy to reevaluate you in 1 year, sooner if needed.    Please remember, that any kind of tremor may be exacerbated by anxiety, anger, nervousness, excitement, dehydration, thyroid disease, sleep deprivation, by caffeine, and low blood sugar values or blood sugar fluctuations. Some medications can exacerbate tremors, this includes certain antidepressant medications.  You are currently on a very low dose of Prozac.  This does not mean you have to change the Prozac.

## 2020-10-23 ENCOUNTER — Ambulatory Visit: Payer: Medicare Other | Admitting: Certified Nurse Midwife

## 2020-11-14 DIAGNOSIS — H903 Sensorineural hearing loss, bilateral: Secondary | ICD-10-CM | POA: Diagnosis not present

## 2020-11-27 DIAGNOSIS — M1990 Unspecified osteoarthritis, unspecified site: Secondary | ICD-10-CM | POA: Diagnosis not present

## 2020-11-27 DIAGNOSIS — M8580 Other specified disorders of bone density and structure, unspecified site: Secondary | ICD-10-CM | POA: Diagnosis not present

## 2020-11-27 DIAGNOSIS — I2510 Atherosclerotic heart disease of native coronary artery without angina pectoris: Secondary | ICD-10-CM | POA: Diagnosis not present

## 2020-11-27 DIAGNOSIS — M0600 Rheumatoid arthritis without rheumatoid factor, unspecified site: Secondary | ICD-10-CM | POA: Diagnosis not present

## 2020-11-27 DIAGNOSIS — Z79899 Other long term (current) drug therapy: Secondary | ICD-10-CM | POA: Diagnosis not present

## 2020-11-27 DIAGNOSIS — M7070 Other bursitis of hip, unspecified hip: Secondary | ICD-10-CM | POA: Diagnosis not present

## 2020-11-27 DIAGNOSIS — G5600 Carpal tunnel syndrome, unspecified upper limb: Secondary | ICD-10-CM | POA: Diagnosis not present

## 2020-12-26 ENCOUNTER — Encounter: Payer: Self-pay | Admitting: Obstetrics and Gynecology

## 2021-01-28 DIAGNOSIS — H6121 Impacted cerumen, right ear: Secondary | ICD-10-CM | POA: Diagnosis not present

## 2021-01-28 DIAGNOSIS — R053 Chronic cough: Secondary | ICD-10-CM | POA: Diagnosis not present

## 2021-01-28 DIAGNOSIS — J3489 Other specified disorders of nose and nasal sinuses: Secondary | ICD-10-CM | POA: Diagnosis not present

## 2021-01-28 DIAGNOSIS — H6501 Acute serous otitis media, right ear: Secondary | ICD-10-CM | POA: Diagnosis not present

## 2021-01-28 DIAGNOSIS — H903 Sensorineural hearing loss, bilateral: Secondary | ICD-10-CM | POA: Diagnosis not present

## 2021-01-28 DIAGNOSIS — R490 Dysphonia: Secondary | ICD-10-CM | POA: Diagnosis not present

## 2021-02-28 DIAGNOSIS — Z79899 Other long term (current) drug therapy: Secondary | ICD-10-CM | POA: Diagnosis not present

## 2021-03-13 DIAGNOSIS — M7070 Other bursitis of hip, unspecified hip: Secondary | ICD-10-CM | POA: Diagnosis not present

## 2021-03-13 DIAGNOSIS — M0579 Rheumatoid arthritis with rheumatoid factor of multiple sites without organ or systems involvement: Secondary | ICD-10-CM | POA: Diagnosis not present

## 2021-03-13 DIAGNOSIS — Z79899 Other long term (current) drug therapy: Secondary | ICD-10-CM | POA: Diagnosis not present

## 2021-03-13 DIAGNOSIS — M0600 Rheumatoid arthritis without rheumatoid factor, unspecified site: Secondary | ICD-10-CM | POA: Diagnosis not present

## 2021-03-13 DIAGNOSIS — M8580 Other specified disorders of bone density and structure, unspecified site: Secondary | ICD-10-CM | POA: Diagnosis not present

## 2021-03-13 DIAGNOSIS — I2510 Atherosclerotic heart disease of native coronary artery without angina pectoris: Secondary | ICD-10-CM | POA: Diagnosis not present

## 2021-03-13 DIAGNOSIS — G5600 Carpal tunnel syndrome, unspecified upper limb: Secondary | ICD-10-CM | POA: Diagnosis not present

## 2021-03-13 DIAGNOSIS — M1990 Unspecified osteoarthritis, unspecified site: Secondary | ICD-10-CM | POA: Diagnosis not present

## 2021-03-24 NOTE — Telephone Encounter (Signed)
I called the pt re: her My Chart message and she reports over the past week she has had episodes of rapid heart beat lastin 5 min to 5 hours... she says is happens at rest and she denies any symptoms associated with it such as dizziness, SOB but she can feel it. She says it has happened before but has been more frequent the past week. Her HR gets into the low 100's... BP has been around 138/80.   Pt has been getting increasingly more SOB with exertion such as climbing the stairs which is unusual for her since she is very active and runs. No difficulty  with running.... no chest pain.   I advised her to continue to monitor.. she will go to the ED if anything worsens. Will forward to Dr. Tresa Endo for review and recommendations and possibly a monitor order. Her next appt is not until 06/2021.

## 2021-04-01 NOTE — Telephone Encounter (Signed)
Spoke to pt and scheduled an appointment with Dr. Tresa Endo on 4/15 for further evaluations. Pt understands to report to ER if symptoms worsen.

## 2021-04-10 NOTE — Progress Notes (Signed)
70 y.o. G70P2002 Divorced White or Caucasian female here for breast & pelvic.    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 Denies vaginal problems. Uses Olive oil prn.   Patient's last menstrual period was 08/10/1998 (exact date).            Sexually active: No.  The current method of family planning is post menopausal status.    Exercising: Yes.    walking, jogging & stretching Smoker:  no  Health Maintenance: Pap:  10-18-18 neg HPV HR neg, 10-23-2019 neg History of abnormal Pap:  no MMG:  12-26-2020 category c density birads 1:neg Colonoscopy:  2015 f/u 61yrs BMD:   2020 normal Gardasil:   n/a Covid-19: moderna Hep C testing: hep c neg 2017 Screening Labs: with PCP   reports that she has never smoked. She has never used smokeless tobacco. She reports that she does not drink alcohol and does not use drugs.  Past Medical History:  Diagnosis Date  . Chest pain   . Pinched nerve in shoulder, left   . RA (rheumatoid arthritis) (HCC)   . Transfusion history     Past Surgical History:  Procedure Laterality Date  . CARPAL TUNNEL RELEASE    . CORONARY ANGIOPLASTY     stint  . TENDON RELEASE Right 2006    Current Outpatient Medications  Medication Sig Dispense Refill  . aspirin EC 81 MG tablet Take 81 mg by mouth daily.    Marland Kitchen atorvastatin (LIPITOR) 40 MG tablet Take 1 tablet (40 mg total) by mouth daily. 90 tablet 3  . CALCIUM PO Take 600 mg by mouth daily.     . Cetirizine HCl 10 MG CAPS Take by mouth.    Elgie Collard SURECLICK 50 MG/ML injection Inject into the skin once a week.    Marland Kitchen FLUoxetine (PROZAC) 10 MG capsule Take by mouth daily.  0  . folic acid (FOLVITE) 1 MG tablet Take 1 mg by mouth daily.    . methotrexate (RHEUMATREX) 2.5 MG  tablet Take 12.5 mg by mouth once a week. Caution:Chemotherapy. Protect from light.    . montelukast (SINGULAIR) 10 MG tablet     . Omega-3 Fatty Acids (FISH OIL) 1000 MG CAPS Take 1,200 mg by mouth.     . ticagrelor (BRILINTA) 60 MG TABS tablet Take 1 tablet (60 mg total) by mouth 2 (two) times daily. 180 tablet 3  . Turmeric (QC TUMERIC COMPLEX PO) Take 1,000 mg by mouth daily.    Marland Kitchen UNABLE TO FIND supplement 8 greens    . VITAMIN D PO Take 1,000 Units by mouth.    . metoprolol tartrate (LOPRESSOR) 25 MG tablet Take 1 tablet (25 mg total) by mouth as needed (for palpitations). (Patient not taking: Reported on 04/15/2021) 30 tablet 3  . nitroGLYCERIN (NITROSTAT) 0.4 MG SL tablet Place 0.4 mg under the tongue every 5 (five) minutes as needed for chest pain. (Patient not taking: Reported on 04/15/2021)     No current facility-administered medications for this visit.    Family History  Problem Relation Age of Onset  . Heart failure Mother   . Heart disease Mother   . Lung cancer Mother 39       smoker  . Heart disease Father   .  Breast cancer Maternal Grandmother 23  . Diabetes Other   . Heart disease Other   . Colon cancer Neg Hx   . Pancreatic cancer Neg Hx   . Stomach cancer Neg Hx   . Rectal cancer Neg Hx     Review of Systems  Constitutional: Negative.   HENT: Negative.   Eyes: Negative.   Respiratory: Negative.   Cardiovascular: Negative.   Gastrointestinal: Negative.   Endocrine: Negative.   Genitourinary: Negative.   Musculoskeletal: Negative.   Skin: Negative.   Allergic/Immunologic: Negative.   Neurological: Negative.   Hematological: Negative.   Psychiatric/Behavioral: Negative.     Exam:   BP 116/70   Pulse 68   Resp 16   Ht 5' 7.25" (1.708 m)   Wt 150 lb (68 kg)   LMP 08/10/1998 (Exact Date)   BMI 23.32 kg/m   Height: 5' 7.25" (170.8 cm)  General appearance: alert, cooperative and appears stated age, no acute distress Head: Normocephalic, without  obvious abnormality Neck: no adenopathy, thyroid normal to inspection and palpation Breasts: No axillary or supraclavicular adenopathy, Left breast density, 9 o'clock position near areolar border, ~1 cm in size, round and smooth, Right breast normal exam Abdomen: soft, non-tender; no masses,  no organomegaly Skin: No rashes or lesions Lymph nodes: Cervical, supraclavicular, and axillary nodes normal. No abnormal inguinal nodes palpated Neurologic: Grossly normal   Pelvic: External genitalia:  no lesions              Urethra:  normal appearing urethra with no masses, tenderness or lesions              Bartholins and Skenes: normal                 Vagina: normal appearing vagina, appropriate for age, normal appearing discharge, no lesions              Cervix: neg cervical motion tenderness, no visible lesions             Bimanual Exam:   Uterus:  normal size, contour, position, consistency, mobility, non-tender and retroverted              Adnexa: no mass, fullness, tenderness                 Joy, CMA Chaperone was present for exam.  A:  Well Woman with normal exam  Breast Density Left Breast  P:   Pap :may discontinue  Mammogram: will schedule left breat ultrasound  Labs: with PCP  Medications: no new  Dexa WNL 2020, next due 2025  Colonsocopy due 2025

## 2021-04-11 ENCOUNTER — Ambulatory Visit (INDEPENDENT_AMBULATORY_CARE_PROVIDER_SITE_OTHER): Payer: Medicare Other

## 2021-04-11 ENCOUNTER — Ambulatory Visit (INDEPENDENT_AMBULATORY_CARE_PROVIDER_SITE_OTHER): Payer: Medicare Other | Admitting: Cardiovascular Disease

## 2021-04-11 ENCOUNTER — Encounter: Payer: Self-pay | Admitting: Cardiovascular Disease

## 2021-04-11 ENCOUNTER — Other Ambulatory Visit: Payer: Self-pay

## 2021-04-11 ENCOUNTER — Encounter: Payer: Self-pay | Admitting: Radiology

## 2021-04-11 VITALS — BP 122/70 | HR 53 | Ht 67.0 in | Wt 150.0 lb

## 2021-04-11 DIAGNOSIS — M069 Rheumatoid arthritis, unspecified: Secondary | ICD-10-CM

## 2021-04-11 DIAGNOSIS — Z8249 Family history of ischemic heart disease and other diseases of the circulatory system: Secondary | ICD-10-CM | POA: Diagnosis not present

## 2021-04-11 DIAGNOSIS — R002 Palpitations: Secondary | ICD-10-CM

## 2021-04-11 DIAGNOSIS — E785 Hyperlipidemia, unspecified: Secondary | ICD-10-CM | POA: Diagnosis not present

## 2021-04-11 DIAGNOSIS — I2510 Atherosclerotic heart disease of native coronary artery without angina pectoris: Secondary | ICD-10-CM

## 2021-04-11 DIAGNOSIS — Z9861 Coronary angioplasty status: Secondary | ICD-10-CM | POA: Diagnosis not present

## 2021-04-11 LAB — CBC
Hematocrit: 39.7 % (ref 34.0–46.6)
Hemoglobin: 13.5 g/dL (ref 11.1–15.9)
MCH: 33.7 pg — ABNORMAL HIGH (ref 26.6–33.0)
MCHC: 34 g/dL (ref 31.5–35.7)
MCV: 99 fL — ABNORMAL HIGH (ref 79–97)
Platelets: 247 10*3/uL (ref 150–450)
RBC: 4.01 x10E6/uL (ref 3.77–5.28)
RDW: 13.1 % (ref 11.7–15.4)
WBC: 3.6 10*3/uL (ref 3.4–10.8)

## 2021-04-11 LAB — COMPREHENSIVE METABOLIC PANEL
ALT: 18 IU/L (ref 0–32)
AST: 25 IU/L (ref 0–40)
Albumin/Globulin Ratio: 1.7 (ref 1.2–2.2)
Albumin: 4.8 g/dL (ref 3.8–4.8)
Alkaline Phosphatase: 79 IU/L (ref 44–121)
BUN/Creatinine Ratio: 29 — ABNORMAL HIGH (ref 12–28)
BUN: 24 mg/dL (ref 8–27)
Bilirubin Total: 0.5 mg/dL (ref 0.0–1.2)
CO2: 25 mmol/L (ref 20–29)
Calcium: 9.2 mg/dL (ref 8.7–10.3)
Chloride: 105 mmol/L (ref 96–106)
Creatinine, Ser: 0.82 mg/dL (ref 0.57–1.00)
Globulin, Total: 2.9 g/dL (ref 1.5–4.5)
Glucose: 85 mg/dL (ref 65–99)
Potassium: 4.3 mmol/L (ref 3.5–5.2)
Sodium: 144 mmol/L (ref 134–144)
Total Protein: 7.7 g/dL (ref 6.0–8.5)
eGFR: 77 mL/min/{1.73_m2} (ref >59)

## 2021-04-11 LAB — TSH: TSH: 3.12 u[IU]/mL (ref 0.450–4.500)

## 2021-04-11 LAB — LIPID PANEL
Chol/HDL Ratio: 2.1 ratio (ref 0.0–4.4)
Cholesterol, Total: 138 mg/dL (ref 100–199)
HDL: 67 mg/dL (ref >39)
LDL Chol Calc (NIH): 59 mg/dL (ref 0–99)
Triglycerides: 52 mg/dL (ref 0–149)
VLDL Cholesterol Cal: 12 mg/dL (ref 5–40)

## 2021-04-11 LAB — MAGNESIUM: Magnesium: 2.4 mg/dL — ABNORMAL HIGH (ref 1.6–2.3)

## 2021-04-11 MED ORDER — METOPROLOL TARTRATE 25 MG PO TABS
25.0000 mg | ORAL_TABLET | ORAL | 3 refills | Status: DC | PRN
Start: 2021-04-11 — End: 2021-05-21

## 2021-04-11 NOTE — Progress Notes (Signed)
[facility] Office Note    Date:  04/11/2021   ID:  Barbara Ayers, DOB 1951/01/20, MRN [id number]  PCP:  Barbara Smoker, MD  Cardiologist:  Barbara Majestic, MD   F/u Ayers evaluation: Referred by Barbara Ayers at [facility]  History of Present Illness:  Barbara Ayers is a 70 y.o. female who I saw on September 14, 2017 through the referral of  Barbara Ayers to establish Ayers care.  I last saw her in May 2021.  She presents for a follow-up office visit with a chief complaint of intermittent tachypalpitations.    Barbara. Ayers is an active female who has a history of rheumatoid arthritis for approximately 20 years.  She has routinely exercised.  In 2018 she began to notice exertional symptoms of vague chest tightness when running.  She is a personal friend of Barbara Ayers ultimately was evaluated by her  in [place], [place].  She underwent an echo Doppler study which showed an EF of 55% with normal diastolic function.  There was trace to mild aortic insufficiency, mild MR and TR.  She was referred for a nuclear stress test which revealed equivocal findings for anteroseptal ischemia, although she did experience chest pain and had ischemic ECG abnormalities and probable rate related bundle branch block and a brief episode of nonsustained VT.  Her chest pain required approximately 55 minutes for complete resolution in recovery.  She  was referred to Barbara [facility] at [facility] in cardiac catheterization on 04/15/2017 showed a 99% ostial stenosis of her LAD which was successfully stented with a 3.012 mm Resolute onyx DES stent postdilated to 3.25 mm.  She was also found to have mild 20% ostial left main narrowing, 40% mid LAD stenosis and 20% mid RCA stenosis.  Following her intervention, she has been pain-free.  She is back exercising and denies any exertional symptomatology.  She has seen Barbara Ayers in follow-up.  Since the patient lives  in [place] [facility] she was referred by Barbara Ayers to me to continue her Ayers care.  When I saw her for initial evaluation in September 2018 she was doing well.  She specifically denies recurrent anginal symptoms, palpitations, presyncope or syncope.  She is followed by Barbara Ayers for primary care.    Over the past year she has remained active.  She does yoga several times per week and is not been running as much as she had in the past.  Recently she has started to notice a vague left upper chest sensation which seems to be exacerbated when she lifts her arms above her head suggesting a possible musculoskeletal etiology.  However this is new.  She is unaware of any definitive exertional precipitation.  She has continued to be on aspirin plus Brilinta 90 mg twice a day and denies bleeding.  She continues to be on methotrexate weekly.  She is on Prozac 10 mg daily.    Since I last saw her in October 17, 2018, in follow-up of her chest sensation, she underwent a nuclear stress test on November 01, 2018.  She was found to have a very small defect of mild severity and to be anterior to apical anterior segment would seem consistent with breast attenuation.  There was no ischemia.  Post stress ejection Ayers was 58%.  There were no diagnostic ST segment changes of ischemia.    She was seen in follow-up in December 2019 at which time her chest pain had  resolved.  I reviewed her nuclear images with her in detail.    She was seen in August 2020 by Barbara Ayers and continue to do well.  She was having occasional back discomfort attributed to muscle spasm.  Last saw her in May 2021 and over the prior year she had continued to do well from a cardiac standpoint.  She is followed by Barbara Ayers for her rheumatoid arthritis.  She denied any recurrent anginal symptoms or shortness of breath.  She was concerned about an abdominal aortic aneurysm since 3 family members had developed this.  She continued to be on  atorvastatin 40 mg for hyperlipidemia with target LDL less than 70.  She was on Enbrel for rheumatoid arthritis in addition to methotrexate.  She continues to be on aspirin and low-dose Brilinta at 60 mg twice a day.    She underwent an abdominal aortic ultrasound on May 13, 2020 which was normal and did not show any evidence for abdominal aortic aneurysm.  Was ectasia of a right common iliac artery and left common iliac artery.  Heart is aortic measurement was 2.4 cm.  She had been doing fairly well until several weeks ago when over a 4-day.  She noticed intermittent tachypalpitations which would have an abrupt onset and abrupt discontinuance.  She felt that her heart rate on 1 occasion increased to 131 bpm.  Several of these episodes lasted several hours for cessation and they all subsided spontaneously.  She denies any recent caffeine use.  She is sleeping well.  She has continued to jog at a very slow pace typically at a pace of 1 mile in 15 minutes.  He denies chest tightness or pressure.  She is not had recent laboratory.  She presents for evaluation.  Past Medical History:  Diagnosis Date  . Chest pain   . Pinched nerve in shoulder, left   . RA (rheumatoid arthritis) ([place])   . Transfusion history     Past Surgical History:  Procedure Laterality Date  . CARPAL TUNNEL RELEASE    . CORONARY ANGIOPLASTY     stint  . TENDON RELEASE Right 2006    Current Medications: Outpatient Medications Prior to Visit  Medication Sig Dispense Refill  . aspirin EC 81 MG tablet Take 81 mg by mouth daily.    Marland Kitchen atorvastatin (LIPITOR) 40 MG tablet Take 1 tablet (40 mg total) by mouth daily. 90 tablet 3  . CALCIUM PO Take 600 mg by mouth daily.     . Cetirizine HCl 10 MG CAPS Take by mouth.    [name] 50 MG/ML injection Inject into the skin once a week.    Marland Kitchen FLUoxetine (PROZAC) 10 MG capsule Take by mouth daily.  0  . folic acid (FOLVITE) 1 MG tablet Take 1 mg by mouth daily.    .  methotrexate (RHEUMATREX) 2.5 MG tablet Take 12.5 mg by mouth once a week. Caution:Chemotherapy. Protect from light.    . montelukast (SINGULAIR) 10 MG tablet     . nitroGLYCERIN (NITROSTAT) 0.4 MG SL tablet Place 0.4 mg under the tongue every 5 (five) minutes as needed for chest pain.    . Omega-3 Fatty Acids (FISH OIL) 1000 MG CAPS Take 1,200 mg by mouth.     . ticagrelor (BRILINTA) 60 MG TABS tablet Take 1 tablet (60 mg total) by mouth 2 (two) times daily. 180 tablet 3  . Turmeric (QC TUMERIC COMPLEX PO) Take 1,000 mg by mouth daily.    Marland Kitchen  UNABLE TO FIND supplement 8 greens    . VITAMIN D PO Take 1,000 Units by mouth.     No facility-administered medications prior to visit.     Allergies:   Amoxicillin   Social History   Socioeconomic History  . Marital status: Divorced    Spouse name: Not on file  . Number of children: Not on file  . Years of education: Not on file  . Highest education level: Not on file  Occupational History  . Not on file  Tobacco Use  . Smoking status: Never Ayers  . Smokeless tobacco: Never Used  Substance and Sexual Activity  . Alcohol use: No  . Drug use: No  . Sexual activity: Not Currently    Birth control/protection: Post-menopausal  Other Topics Concern  . Not on file  Social History Narrative  . Not on file   Social Determinants of Health   Financial Resource Strain: Not on file  Food Insecurity: Not on file  Transportation Needs: Not on file  Physical Activity: Not on file  Stress: Not on file  Social Connections: Not on file    Additional social history is notable in that she is divorced for 8 years.  She has 2 children and 2 grandchildren.  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  There is no history of tobacco use or alcohol use.  She exercises at least 5 days per week.  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  Her father had suffered an initial MI at age 23. She has 2 sisters who are ages 46 and 66.  ROS General: Negative; No fevers, chills, or night sweats;  HEENT: Negative; No changes in vision or hearing, sinus congestion, difficulty swallowing Pulmonary: Negative; No cough, wheezing, shortness of breath, hemoptysis Cardiovascular: See history of present illness GI: Negative; No nausea, vomiting, diarrhea, or abdominal pain GU: Negative; No dysuria, hematuria, or difficulty voiding Musculoskeletal: Positive for rheumatoid arthritis Hematologic/Oncology: Negative; no easy bruising, bleeding Endocrine: Negative; no heat/cold intolerance; no diabetes Neuro: Negative; no changes in balance, headaches Skin: Negative; No rashes or skin lesions Psychiatric: Negative; No behavioral problems, depression Sleep: Negative; No snoring, daytime sleepiness, hypersomnolence, bruxism, restless legs, hypnogognic hallucinations, no cataplexy Other comprehensive 14 point system review is negative.   PHYSICAL EXAM:   VS:  BP 122/70   Pulse (!) 53   Ht $R'5\' 7"'PN$  (1.702 m)   Wt 150 lb (68 kg)   LMP 08/10/1998 (Exact Date)   SpO2 98%   BMI 23.49 kg/m     Repeat blood pressure by me was 124/70  Wt Readings from Last 3 Encounters:  04/11/21 150 lb (68 kg)  09/24/20 148 lb (67.1 kg)  04/30/20 149 lb (67.6 kg)     General: Alert, oriented, no distress.  Skin: normal turgor, no rashes, warm and dry HEENT: Normocephalic, atraumatic. Pupils equal round and reactive to light; sclera anicteric; extraocular muscles intact;  Nose without nasal septal hypertrophy Mouth/Parynx benign; Mallinpatti scale 2 Neck: No JVD, no carotid bruits; normal carotid upstroke Lungs: clear to ausculatation  and percussion; no wheezing or rales Chest wall: without tenderness to palpitation Heart: PMI not  displaced, RRR, s1 s2 normal, 1/6 systolic murmur, no diastolic murmur, no rubs, gallops, thrills, or heaves Abdomen: soft, nontender; no hepatosplenomehaly, BS+; abdominal aorta nontender and not dilated by palpation. Back: no CVA tenderness Pulses 2+ Musculoskeletal: full range of motion, normal strength, no joint deformities Extremities: no clubbing cyanosis or edema, Homan's sign negative  Neurologic: grossly nonfocal; Cranial nerves grossly wnl Psychologic: Normal mood and affect    Studies/Labs Reviewed:   ECG (independently read by me): Sinus bradycardia at 53; no ectopy, normal intervals  Apr 30, 2020 2021 ECG (independently read by me): Normal sinus rhythm at 65 bpm.  No ectopy.  Normal intervals.  No ST segment changes.  October 17, 2018 ECG (independently read by me): Sinus bradycardia 58 bpm.  Normal intervals.  No ectopy.  No significant ST-T abnormalities.  September 14, 2017 ECG (independently read by me): normal sinus rhythm at 72 bpm.  Normal intervals.  Mild RV conduction delay.  No significant ST T abnormalities.  Recent Labs: BMP Latest Ref Rng & Units 09/16/2017 10/08/2014  Glucose 65 - 99 mg/dL 81 82  BUN 8 - 27 mg/dL 18 19  Creatinine 0.57 - 1.00 mg/dL 0.82 0.89  BUN/Creat Ratio 12 - 28 22 -  Sodium 134 - 144 mmol/L 140 141  Potassium 3.5 - 5.2 mmol/L 4.3 4.8  Chloride 96 - 106 mmol/L 101 103  CO2 20 - 29 mmol/L 24 27  Calcium 8.7 - 10.3 mg/dL 9.2 9.7     Hepatic Function Latest Ref Rng & Units 09/16/2017  Total Protein 6.0 - 8.5 g/dL 7.3  Albumin 3.6 - 4.8 g/dL 4.4  AST 0 - 40 IU/L 28  ALT 0 - 32 IU/L 22  Alk Phosphatase 39 - 117 IU/L 88  Total Bilirubin 0.0 - 1.2 mg/dL 0.7    CBC Latest Ref Rng & Units 09/16/2017 10/14/2016 10/14/2016  WBC 3.4 - 10.8 x10E3/uL 4.3 4.1 -  Hemoglobin 11.1 - 15.9 g/dL 13.1 14.1 14.2  Hematocrit 34.0 - 46.6 % 39.0 42.6 -   Platelets 150 - 379 x10E3/uL 239 229 -   Lab Results  Component Value Date   MCV 95 09/16/2017   MCV 98.8 10/14/2016   Lab Results  Component Value Date   TSH 3.170 09/16/2017   Lab Results  Component Value Date   HGBA1C 5.4 10/23/2019     BNP No results found for: BNP  ProBNP No results found for: PROBNP   Lipid Panel     Component Value Date/Time   CHOL 132 10/23/2019 1433   TRIG 72 10/23/2019 1433   HDL 67 10/23/2019 1433   CHOLHDL 2.0 10/23/2019 1433   CHOLHDL 3.0 10/14/2016 1410   VLDL 12 10/14/2016 1410   LDLCALC 51 10/23/2019 1433     RADIOLOGY: No results found.   Additional studies/ records that were reviewed today include:  I [facility].  I reviewed the patient's noninvasive studies and cardiac catheterization and PCI records.   ASSESSMENT:    1. Palpitations   2. CAD S/P percutaneous coronary angioplasty   3. Hyperlipidemia with target LDL less than 70   4. Family history of abdominal aortic aneurysm   5. Rheumatoid arthritis, involving unspecified site, unspecified whether rheumatoid factor present [facility])     PLAN:  Barbara Ayers is a very pleasant 37 -year-old female who has a greater than 20 year history of rheumatoid arthritis and developed new onset exertional chest pressure which  occurred while running in 2018.  She was found to have a 99% ostial LAD stenosis as the culprit lesion contributing to her symptomatology and underwent successful DES stenting with insertion of a Resolute Onyx 3.012 mm DES stent on 04/15/2017.  When I saw her for initial evaluation she was without recurrent anginal symptomatology and had resumed physical activity and denied any exertional symptoms.  On her initial stress test she also had experienced an episode of nonsustained VT.  She denies any palpitations.  She was not on any beta-blocker therapy and continued to be bradycardic.  Her physical exam  suggested the possibility of mitral valve prolapse which was not detected on 2D echo Doppler imaging.  When I saw her in October 2019 she had developed somewhat atypical upper left lateral pectoral chest discomfort without any exertional component.  She underwent a follow-up exercise Myoview study which was low risk.  She did not develop any chest pain or diagnostic ECG changes.  Mild thinning was noted in the anterior and apical anterior segment which was felt most likely due to breast attenuation.  Presently, she has continued be without chest pain or exertional dyspnea.  Concerns for family history for abdominal aortic aneurysm, she underwent an abdominal aortic ultrasound which essentially was normal and her maximal aortic diameter was 2.4 cm.  Several weeks ago she had a 4-day episode where she had recurrent episodes of palpitations which would last anywhere from 15 minutes to several hours.  These would have an abrupt onset and abrupt discontinuance and would resolve on their own.  EG today shows sinus bradycardia 53 bpm and for this reason I will not start her on daily beta-blocker therapy but will give her a prescription for metoprolol tartrate to take 25 to 50 mg as needed if recurrent tachypalpitations develop.  I am scheduling her for an echo Doppler study as well as a 2-week Zio patch monitor.  He is on atorvastatin 40 mg for hyperlipidemia.  She continues to be on DAPT with aspirin and low-dose ticagrelor 60 mg twice a day.  She is on fluoxetine 10 mg for anxiety/depression.  She continues to be on Enbrel and methotrexate for her rheumatoid arthritis.  She is fasting today and labs will be obtained consisting of a comprehensive metabolic panel, magnesium, TSH, CBC, and fasting lipid studies.  I will see her in 6 to 8 weeks for reevaluation or sooner as needed.    Medication Adjustments/Labs and Tests Ordered: Current medicines are reviewed at length with the patient today.  Concerns regarding  medicines are outlined above.  Medication changes, Labs and Tests ordered today are listed in the Patient Instructions below.  Patient Instructions  Medication Instructions:  Take metoprolol tartrate (Lopressor) 25 mg AS NEEDED for palpitations  *If you need a refill on your cardiac medications before your next appointment, please call your pharmacy*   Lab Work: CMET, Mag, TSH, CBC, Lipids today  If you have labs (blood work) drawn today and your tests are completely normal, you will receive your results only by: Marland Kitchen MyChart Message (if you have MyChart) OR . A paper copy in the mail If you have any lab test that is abnormal or we need to change your treatment, we will call you to review the results.   Testing/Procedures: Your physician has requested that you have an echocardiogram. Echocardiography is a painless test that uses sound waves to create images of your heart. It provides your doctor with information about the size and shape  of your heart and how well your heart's chambers and valves are working. This procedure takes approximately one hour. There are no restrictions for this procedure. This will be done at our [facility] location:  [place] has requested you wear your ZIO patch monitor__14__days.   This is a single patch monitor.  [employer] supplies one patch monitor per enrollment.  Additional stickers are not available.   Please do not apply patch if you will be having a Nuclear Stress Test, Echocardiogram, Cardiac CT, MRI, or Chest Xray during the time frame you would be wearing the monitor. The patch cannot be worn during these tests.  You cannot remove and re-apply the ZIO XT patch monitor.   Your ZIO patch monitor will be sent USPS Priority mail from [facility] directly to your home address. The monitor may also be mailed to a PO BOX if home delivery is not available.   It may take  3-5 days to receive your monitor after you have been enrolled.   Once you have received you monitor, please review enclosed instructions.  Your monitor has already been registered assigning a specific monitor serial # to you.   Applying the monitor   Shave hair from upper left chest.   Hold abrader disc by orange tab.  Rub abrader in 40 strokes over left upper chest as indicated in your monitor instructions.   Clean area with 4 enclosed alcohol pads .  Use all pads to assure are is cleaned thoroughly.  Let dry.   Apply patch as indicated in monitor instructions.  Patch will be place under collarbone on left side of chest with arrow pointing upward.   Rub patch adhesive wings for 2 minutes.Remove white label marked "1".  Remove white label marked "2".  Rub patch adhesive wings for 2 additional minutes.   While looking in a mirror, press and release button in center of patch.  A small green light will flash 3-4 times .  This will be your only indicator the monitor has been turned on.     Do not shower for the first 24 hours.  You may shower after the first 24 hours.   Press button if you feel a symptom. You will hear a small click.  Record Date, Time and Symptom in the Patient Log Book.   When you are ready to remove patch, follow instructions on last 2 pages of Patient Log Book.  Stick patch monitor onto last page of Patient Log Book.   Place Patient Log Book in [place] box.  Use locking tab on box and tape box closed securely.  The [employer] has [employer] on it.  Please place in mailbox as soon as possible.  Your physician should have your test results approximately 7 days after the monitor has been mailed back to [facility].   Call [place] at [phone] if you have questions regarding your ZIO XT patch monitor.  Call them immediately if you see an orange light blinking on your monitor.   If your monitor falls off in less than 4 days contact our  Monitor department at [phone].  If your monitor becomes loose or falls off after 4 days call [employer] at [phone] for suggestions on securing your monitor.    Follow-Up: At [facility], you and your health needs are our priority.  As part of our continuing mission  to provide you with exceptional heart care, we have created designated Provider Care Teams.  These Care Teams include your primary Cardiologist (physician) and Advanced Practice Providers (APPs -  Physician Assistants and Nurse Practitioners) who all work together to provide you with the care you need, when you need it.  We recommend signing up for the patient portal called "MyChart".  Sign up information is provided on this After Visit Summary.  MyChart is used to connect with patients for Virtual Visits (Telemedicine).  Patients are able to view lab/test results, encounter notes, upcoming appointments, etc.  Non-urgent messages can be sent to your provider as well.   To learn more about what you can do with MyChart, go to [email or web address].    Your next appointment:   8 week(s)  The format for your next appointment:   In Person  Provider:   Shelva Majestic, MD      Signed, Barbara Majestic, MD, [facility]  04/11/2021 12:41 PM    [facility] [address] [place], [place], [place]  [address] Phone: [phone]

## 2021-04-11 NOTE — Patient Instructions (Signed)
Medication Instructions:  Take metoprolol tartrate (Lopressor) 25 mg AS NEEDED for palpitations  *If you need a refill on your cardiac medications before your next appointment, please call your pharmacy*   Lab Work: CMET, Mag, TSH, CBC, Lipids today  If you have labs (blood work) drawn today and your tests are completely normal, you will receive your results only by: Marland Kitchen MyChart Message (if you have MyChart) OR . A paper copy in the mail If you have any lab test that is abnormal or we need to change your treatment, we will call you to review the results.   Testing/Procedures: Your physician has requested that you have an echocardiogram. Echocardiography is a painless test that uses sound waves to create images of your heart. It provides your doctor with information about the size and shape of your heart and how well your heart's chambers and valves are working. This procedure takes approximately one hour. There are no restrictions for this procedure. This will be done at our [facility] location:  [address]   ZIO XT- Long Term Monitor Instructions   Your physician has requested you wear your ZIO patch monitor__14__days.   This is a single patch monitor.  [employer] supplies one patch monitor per enrollment.  Additional stickers are not available.   Please do not apply patch if you will be having a Nuclear Stress Test, Echocardiogram, Cardiac CT, MRI, or Chest Xray during the time frame you would be wearing the monitor. The patch cannot be worn during these tests.  You cannot remove and re-apply the ZIO XT patch monitor.   Your ZIO patch monitor will be sent USPS Priority mail from [facility] directly to your home address. The monitor may also be mailed to a PO BOX if home delivery is not available.   It may take 3-5 days to receive your monitor after you have been enrolled.   Once you have received you monitor, please review enclosed instructions.  Your  monitor has already been registered assigning a specific monitor serial # to you.   Applying the monitor   Shave hair from upper left chest.   Hold abrader disc by orange tab.  Rub abrader in 40 strokes over left upper chest as indicated in your monitor instructions.   Clean area with 4 enclosed alcohol pads .  Use all pads to assure are is cleaned thoroughly.  Let dry.   Apply patch as indicated in monitor instructions.  Patch will be place under collarbone on left side of chest with arrow pointing upward.   Rub patch adhesive wings for 2 minutes.Remove white label marked "1".  Remove white label marked "2".  Rub patch adhesive wings for 2 additional minutes.   While looking in a mirror, press and release button in center of patch.  A small green light will flash 3-4 times .  This will be your only indicator the monitor has been turned on.     Do not shower for the first 24 hours.  You may shower after the first 24 hours.   Press button if you feel a symptom. You will hear a small click.  Record Date, Time and Symptom in the Patient Log Book.   When you are ready to remove patch, follow instructions on last 2 pages of Patient Log Book.  Stick patch monitor onto last page of Patient Log Book.   Place Patient Log Book in [place] box.  Use locking tab on box and  tape box closed securely.  The [employer] and [employer] has [employer] on it.  Please place in mailbox as soon as possible.  Your physician should have your test results approximately 7 days after the monitor has been mailed back to [facility].   Call [facility] Customer Care at [phone] if you have questions regarding your ZIO XT patch monitor.  Call them immediately if you see an orange light blinking on your monitor.   If your monitor falls off in less than 4 days contact our Monitor department at [phone].  If your monitor becomes loose or falls off after 4 days call [employer] at [phone] for suggestions on  securing your monitor.    Follow-Up: At [facility], you and your health needs are our priority.  As part of our continuing mission to provide you with exceptional heart care, we have created designated Provider Care Teams.  These Care Teams include your primary Cardiologist (physician) and Advanced Practice Providers (APPs -  Physician Assistants and Nurse Practitioners) who all work together to provide you with the care you need, when you need it.  We recommend signing up for the patient portal called "MyChart".  Sign up information is provided on this After Visit Summary.  MyChart is used to connect with patients for Virtual Visits (Telemedicine).  Patients are able to view lab/test results, encounter notes, upcoming appointments, etc.  Non-urgent messages can be sent to your provider as well.   To learn more about what you can do with MyChart, go to [email or web address].    Your next appointment:   8 week(s)  The format for your next appointment:   In Person  Provider:   Nicki Guadalajara, MD

## 2021-04-11 NOTE — Progress Notes (Signed)
Enrolled patient for a 14 day Zio XT  monitor to be mailed to patients home  °

## 2021-04-15 ENCOUNTER — Other Ambulatory Visit: Payer: Self-pay

## 2021-04-15 ENCOUNTER — Telehealth: Payer: Self-pay | Admitting: *Deleted

## 2021-04-15 ENCOUNTER — Ambulatory Visit (INDEPENDENT_AMBULATORY_CARE_PROVIDER_SITE_OTHER): Payer: Medicare Other | Admitting: Nurse Practitioner

## 2021-04-15 ENCOUNTER — Encounter: Payer: Self-pay | Admitting: Nurse Practitioner

## 2021-04-15 VITALS — BP 116/70 | HR 68 | Resp 16 | Ht 67.25 in | Wt 150.0 lb

## 2021-04-15 DIAGNOSIS — R922 Inconclusive mammogram: Secondary | ICD-10-CM

## 2021-04-15 DIAGNOSIS — Z01419 Encounter for gynecological examination (general) (routine) without abnormal findings: Secondary | ICD-10-CM | POA: Diagnosis not present

## 2021-04-15 NOTE — Patient Instructions (Signed)
Health Maintenance After Age 70 After age 70, you are at a higher risk for certain long-term diseases and infections as well as injuries from falls. Falls are a major cause of broken bones and head injuries in people who are older than age 70. Getting regular preventive care can help to keep you healthy and well. Preventive care includes getting regular testing and making lifestyle changes as recommended by your health care provider. Talk with your health care provider about:  Which screenings and tests you should have. A screening is a test that checks for a disease when you have no symptoms.  A diet and exercise plan that is right for you. What should I know about screenings and tests to prevent falls? Screening and testing are the best ways to find a health problem early. Early diagnosis and treatment give you the best chance of managing medical conditions that are common after age 70. Certain conditions and lifestyle choices may make you more likely to have a fall. Your health care provider may recommend:  Regular vision checks. Poor vision and conditions such as cataracts can make you more likely to have a fall. If you wear glasses, make sure to get your prescription updated if your vision changes.  Medicine review. Work with your health care provider to regularly review all of the medicines you are taking, including over-the-counter medicines. Ask your health care provider about any side effects that may make you more likely to have a fall. Tell your health care provider if any medicines that you take make you feel dizzy or sleepy.  Osteoporosis screening. Osteoporosis is a condition that causes the bones to get weaker. This can make the bones weak and cause them to break more easily.  Blood pressure screening. Blood pressure changes and medicines to control blood pressure can make you feel dizzy.  Strength and balance checks. Your health care provider may recommend certain tests to check your  strength and balance while standing, walking, or changing positions.  Foot health exam. Foot pain and numbness, as well as not wearing proper footwear, can make you more likely to have a fall.  Depression screening. You may be more likely to have a fall if you have a fear of falling, feel emotionally low, or feel unable to do activities that you used to do.  Alcohol use screening. Using too much alcohol can affect your balance and may make you more likely to have a fall. What actions can I take to lower my risk of falls? General instructions  Talk with your health care provider about your risks for falling. Tell your health care provider if: ? You fall. Be sure to tell your health care provider about all falls, even ones that seem minor. ? You feel dizzy, sleepy, or off-balance.  Take over-the-counter and prescription medicines only as told by your health care provider. These include any supplements.  Eat a healthy diet and maintain a healthy weight. A healthy diet includes low-fat dairy products, low-fat (lean) meats, and fiber from whole grains, beans, and lots of fruits and vegetables. Home safety  Remove any tripping hazards, such as rugs, cords, and clutter.  Install safety equipment such as grab bars in bathrooms and safety rails on stairs.  Keep rooms and walkways well-lit. Activity  Follow a regular exercise program to stay fit. This will help you maintain your balance. Ask your health care provider what types of exercise are appropriate for you.  If you need a cane or walker,   use it as recommended by your health care provider.  Wear supportive shoes that have nonskid soles.   Lifestyle  Do not drink alcohol if your health care provider tells you not to drink.  If you drink alcohol, limit how much you have: ? 0-1 drink a day for women. ? 0-2 drinks a day for men.  Be aware of how much alcohol is in your drink. In the [place] one drink equals one typical bottle of beer (12  oz), one-half glass of wine (5 oz), or one shot of hard liquor (1 oz).  Do not use any products that contain nicotine or tobacco, such as cigarettes and e-cigarettes. If you need help quitting, ask your health care provider. Summary  Having a healthy lifestyle and getting preventive care can help to protect your health and wellness after age 70.  Screening and testing are the best way to find a health problem early and help you avoid having a fall. Early diagnosis and treatment give you the best chance for managing medical conditions that are more common for people who are older than age 70.  Falls are a major cause of broken bones and head injuries in people who are older than age 70. Take precautions to prevent a fall at home.  Work with your health care provider to learn what changes you can make to improve your health and wellness and to prevent falls. This information is not intended to replace advice given to you by your health care provider. Make sure you discuss any questions you have with your health care provider. Document Revised: 04/06/2019 Document Reviewed: 10/27/2017 Elsevier Patient Education  2021 Elsevier Inc.  

## 2021-04-15 NOTE — Telephone Encounter (Signed)
-----   Message from Clarita Crane, NP sent at 04/15/2021  9:03 AM EDT ----- Please schedule left breast ultrasound at [facility] (last mammogram 11/2020). She has a smooth round area at 9 o'clock at [place].

## 2021-04-15 NOTE — Telephone Encounter (Signed)
Order filled out and taken to [facility] desk for signature. Will fax to [place] once signed.

## 2021-04-15 NOTE — Telephone Encounter (Signed)
Order signed by Tresa Endo, NP and faxed to [facility]. [facility] to call patient for scheduling.   Encounter closed.

## 2021-04-17 DIAGNOSIS — R002 Palpitations: Secondary | ICD-10-CM | POA: Diagnosis not present

## 2021-05-06 ENCOUNTER — Other Ambulatory Visit: Payer: Self-pay | Admitting: Cardiovascular Disease

## 2021-05-06 DIAGNOSIS — R002 Palpitations: Secondary | ICD-10-CM | POA: Diagnosis not present

## 2021-05-09 ENCOUNTER — Other Ambulatory Visit (HOSPITAL_COMMUNITY): Payer: Medicare Other

## 2021-05-11 ENCOUNTER — Other Ambulatory Visit: Payer: Self-pay | Admitting: Cardiovascular Disease

## 2021-05-14 DIAGNOSIS — J300 Vasomotor rhinitis: Secondary | ICD-10-CM | POA: Diagnosis not present

## 2021-05-21 ENCOUNTER — Other Ambulatory Visit: Payer: Self-pay | Admitting: Cardiovascular Disease

## 2021-05-30 ENCOUNTER — Ambulatory Visit (HOSPITAL_COMMUNITY): Payer: Medicare Other | Attending: Cardiovascular Disease

## 2021-05-30 ENCOUNTER — Other Ambulatory Visit: Payer: Self-pay

## 2021-05-30 DIAGNOSIS — R002 Palpitations: Secondary | ICD-10-CM | POA: Diagnosis not present

## 2021-05-30 DIAGNOSIS — Z9861 Coronary angioplasty status: Secondary | ICD-10-CM | POA: Diagnosis not present

## 2021-05-30 DIAGNOSIS — I2510 Atherosclerotic heart disease of native coronary artery without angina pectoris: Secondary | ICD-10-CM | POA: Insufficient documentation

## 2021-05-30 LAB — ECHOCARDIOGRAM COMPLETE
Area-P 1/2: 4.15 cm2
S' Lateral: 2.8 cm

## 2021-06-03 DIAGNOSIS — G3184 Mild cognitive impairment, so stated: Secondary | ICD-10-CM | POA: Diagnosis not present

## 2021-06-03 DIAGNOSIS — Z880 Allergy status to penicillin: Secondary | ICD-10-CM | POA: Diagnosis not present

## 2021-06-11 DIAGNOSIS — R922 Inconclusive mammogram: Secondary | ICD-10-CM | POA: Diagnosis not present

## 2021-06-11 DIAGNOSIS — N6459 Other signs and symptoms in breast: Secondary | ICD-10-CM | POA: Diagnosis not present

## 2021-06-12 DIAGNOSIS — M0579 Rheumatoid arthritis with rheumatoid factor of multiple sites without organ or systems involvement: Secondary | ICD-10-CM | POA: Diagnosis not present

## 2021-06-12 DIAGNOSIS — Z79899 Other long term (current) drug therapy: Secondary | ICD-10-CM | POA: Diagnosis not present

## 2021-06-16 ENCOUNTER — Encounter: Payer: Self-pay | Admitting: Nurse Practitioner

## 2021-06-17 DIAGNOSIS — F411 Generalized anxiety disorder: Secondary | ICD-10-CM | POA: Diagnosis not present

## 2021-06-17 DIAGNOSIS — E7800 Pure hypercholesterolemia, unspecified: Secondary | ICD-10-CM | POA: Diagnosis not present

## 2021-06-17 DIAGNOSIS — Z0000 Encounter for general adult medical examination without abnormal findings: Secondary | ICD-10-CM | POA: Diagnosis not present

## 2021-06-17 DIAGNOSIS — I2510 Atherosclerotic heart disease of native coronary artery without angina pectoris: Secondary | ICD-10-CM | POA: Diagnosis not present

## 2021-06-17 DIAGNOSIS — R7303 Prediabetes: Secondary | ICD-10-CM | POA: Diagnosis not present

## 2021-06-17 DIAGNOSIS — M069 Rheumatoid arthritis, unspecified: Secondary | ICD-10-CM | POA: Diagnosis not present

## 2021-06-17 DIAGNOSIS — Z79899 Other long term (current) drug therapy: Secondary | ICD-10-CM | POA: Diagnosis not present

## 2021-06-17 DIAGNOSIS — N3941 Urge incontinence: Secondary | ICD-10-CM | POA: Diagnosis not present

## 2021-07-03 ENCOUNTER — Other Ambulatory Visit: Payer: Self-pay

## 2021-07-03 ENCOUNTER — Ambulatory Visit (INDEPENDENT_AMBULATORY_CARE_PROVIDER_SITE_OTHER): Payer: Medicare Other | Admitting: Cardiovascular Disease

## 2021-07-03 ENCOUNTER — Encounter: Payer: Self-pay | Admitting: Cardiovascular Disease

## 2021-07-03 DIAGNOSIS — R002 Palpitations: Secondary | ICD-10-CM

## 2021-07-03 DIAGNOSIS — I2510 Atherosclerotic heart disease of native coronary artery without angina pectoris: Secondary | ICD-10-CM | POA: Diagnosis not present

## 2021-07-03 DIAGNOSIS — M069 Rheumatoid arthritis, unspecified: Secondary | ICD-10-CM

## 2021-07-03 DIAGNOSIS — E785 Hyperlipidemia, unspecified: Secondary | ICD-10-CM | POA: Diagnosis not present

## 2021-07-03 DIAGNOSIS — Z9861 Coronary angioplasty status: Secondary | ICD-10-CM | POA: Diagnosis not present

## 2021-07-03 NOTE — Patient Instructions (Signed)
Medication Instructions:  Your physician recommends that you continue on your current medications as directed. Please refer to the Current Medication list given to you today.  *If you need a refill on your cardiac medications before your next appointment, please call your pharmacy*   Follow-Up: At [facility], you and your health needs are our priority.  As part of our continuing mission to provide you with exceptional heart care, we have created designated Provider Care Teams.  These Care Teams include your primary Cardiologist (physician) and Advanced Practice Providers (APPs -  Physician Assistants and Nurse Practitioners) who all work together to provide you with the care you need, when you need it.  We recommend signing up for the patient portal called "MyChart".  Sign up information is provided on this After Visit Summary.  MyChart is used to connect with patients for Virtual Visits (Telemedicine).  Patients are able to view lab/test results, encounter notes, upcoming appointments, etc.  Non-urgent messages can be sent to your provider as well.   To learn more about what you can do with MyChart, go to [email or web address].    Your next appointment:   6 month(s)  The format for your next appointment:   In Person  Provider:   Thomas Kelly, MD  

## 2021-07-03 NOTE — Progress Notes (Signed)
Cardiology Office Note    Date:  07/03/2021   ID:  Barbara Ayers, Barbara Ayers May 07, 1951, MRN [id number]  PCP:  Glenis Smoker, MD  Cardiologist:  Shelva Majestic, MD   F/u cardiology evaluation: Referred by [name] at [facility]  History of Present Illness:  Barbara Ayers is a 70 y.o. female who I saw on September 14, 2017 through the referral of  [name] to establish cardiology care.  I last saw her in April 2022 with a chief complaint of intermittent tachypalpitations.  She presents for 15-monthfollow-up cardiology evaluation.  Barbara. Ayers an active female who has a history of rheumatoid arthritis for approximately 20 years.  She has routinely exercised.  In 2018 she began to notice exertional symptoms of vague chest tightness when running.  She is a personal friend of Dr. [place]dette [place]  She underwent an echo Doppler study which showed an EF of 55% with normal diastolic function.  There was trace to mild aortic insufficiency, mild MR and TR.  She was referred for a nuclear stress test which revealed equivocal findings for anteroseptal ischemia, although she did experience chest pain and had ischemic ECG abnormalities and probable rate related bundle branch block and a brief episode of nonsustained VT.  Her chest pain required approximately 55 minutes for complete resolution in recovery.  [facility]  [facility]2017 showed a 99% ostial stenosis of her LAD which was successfully stented with a 3.012 mm Resolute onyx DES stent postdilated to 3.25 mm.  She was also found to have mild 20% ostial left main narrowing, 40% mid LAD stenosis and 20% mid RCA stenosis.  Following her intervention, she has been pain-free.  She is back exercising and denies any exertional symptomatology.  She has seen Dr. BMathis Budin follow-up.  [place] BWendi Snipesto me to continue her cardiology care.  When I saw her for initial evaluation in September 2018 she was doing well.  She specifically denies recurrent anginal symptoms, palpitations, presyncope or syncope.  She is followed by Dr. GInda Merlinfor primary care.    Over the past year she has remained active.  She does yoga several times per week and is not been running as much as she had in the past.  Recently she has started to notice a vague left upper chest sensation which seems to be exacerbated when she lifts her arms above her head suggesting a possible musculoskeletal etiology.  However this is new.  She is unaware of any definitive exertional precipitation.  She has continued to be on aspirin plus Brilinta 90 mg twice a day and denies bleeding.  She continues to be on methotrexate weekly.  She is on Prozac 10 mg daily.    Since I last saw her in October 17, 2018, in follow-up of her chest sensation, she underwent a nuclear stress test on November 01, 2018.  She was found to have a very small defect of mild severity and to be anterior to apical anterior segment would seem consistent with breast attenuation.  There was no ischemia.  Post stress ejection fraction was 58%.  There were no diagnostic ST segment changes of ischemia.    She was seen in follow-up in December 2019 at which time her chest pain had resolved.  I reviewed her nuclear images with her in detail.    She was seen in August 2020 by [place], [place] and continue to do well.  She was having occasional back discomfort attributed to muscle spasm.  Last saw her in May 2021 and over the prior year she had continued to do well from a cardiac standpoint.  She is followed by Dr. Dossie Der for her rheumatoid arthritis.  She denied any recurrent anginal symptoms or shortness of breath.  She was concerned about an abdominal aortic aneurysm since 3 family members had developed this.  She continued to be  on atorvastatin 40 mg for hyperlipidemia with target LDL less than 70.  She was on Enbrel for rheumatoid arthritis in addition to methotrexate.  She continues to be on aspirin and low-dose Brilinta at 60 mg twice a day.    She underwent an abdominal aortic ultrasound on May 13, 2020 which was normal and did not show any evidence for abdominal aortic aneurysm.  Was ectasia of a right common iliac artery and left common iliac artery.  Heart is aortic measurement was 2.4 cm.  I last saw her in April 2022 she had been doing fairly well until several weeks prior to that evaluation when she noticed intermittent tachypalpitations over 4-day.period which would have an abrupt onset and abrupt discontinuance.  On one occasion she felt her heart rate increased to 131 bpm.  Several of these episodes lasted several hours for cessation and they all subsided spontaneously.  She denied any recent caffeine use.  She was sleeping well.  She continued to jog at a very slow pace typically at a pace of 1 mile in 15 minutes.  He denied chest tightness or pressure.  During that evaluation I recommended that she wear a 2-week Zio patch monitor, recommended laboratory, and a 2D echo Doppler study.  She wore a Zio patch monitor from April 21 through May 01, 2021.  The predominant rhythm was sinus rhythm with an average heart rate at 71 bpm.  The slowest heart rate was sinus bradycardia at 44 bpm which occurred at 6 AM while she was still sleeping.  The maximum heart rate was 171.  She had 118 short bursts of SVT with the run with the fastest interval lasting 4 beats with a maximum rate of 171.  The longest episode lasted 11.6 seconds with an average rate of 136.  Some of the episodes of SVT may possibly have been atrial tachycardia with variable block.  There appeared to be an episode of atrial flutter which lasted for 5 minutes and 28 seconds at an average rate at 139.  There were rare isolated PACs, atrial couplets there were rare PAC  VCs.  Laboratory showed normal chemistry with creatinine 0.82.  Potassium 4.3.  Hemoglobin hematocrit were stable at 13.5 and 39.7.  Magnesium was 2.4.  Lipid studies were excellent with total cholesterol 138, triglycerides 52, HDL 67, and LDL 59.  A 2D echo Doppler study was done on May 30, 2021 which showed an EF of 60 to 65% with mild concentric LVH.  There was mild mitral regurgitation.  There was borderline dilatation of the aortic root.  Since I last saw her, she has felt well.  She has been unaware of any tachypalpitations except on one occasion when she did take metoprolol titrate 25 mg with prompt relief.  She is unaware of any recurrent palpitations.  She has continued to be fairly active working in the yard.  She does  admit to some fatigue.  She believes she is sleeping well.  Denies any chest pain.  She denies any exertional dyspnea.  Past Medical History:  Diagnosis Date   Chest pain    Pinched nerve in shoulder, left    RA (rheumatoid arthritis) ([place])    Transfusion history     Past Surgical History:  Procedure Laterality Date   CARPAL TUNNEL RELEASE     CORONARY ANGIOPLASTY     stint   TENDON RELEASE Right 2006    Current Medications: Outpatient Medications Prior to Visit  Medication Sig Dispense Refill   aspirin EC 81 MG tablet Take 81 mg by mouth daily.     atorvastatin (LIPITOR) 40 MG tablet TAKE 1 TABLET BY MOUTH EVERY DAY 90 tablet 3   BRILINTA 60 MG TABS tablet TAKE 1 TABLET BY MOUTH TWICE A DAY 180 tablet 3   CALCIUM PO Take 600 mg by mouth daily.      Cetirizine HCl 10 MG CAPS Take by mouth.     ENBREL SURECLICK 50 MG/ML injection Inject into the skin once a week.     folic acid (FOLVITE) 1 MG tablet Take 1 mg by mouth daily.     methotrexate (RHEUMATREX) 2.5 MG tablet Take 12.5 mg by mouth once a week. Caution:Chemotherapy. Protect from light.     metoprolol tartrate (LOPRESSOR) 25 MG tablet TAKE 1 TABLET BY MOUTH AS NEEDED FOR PALPITATIONS 90 tablet 3    nitroGLYCERIN (NITROSTAT) 0.4 MG SL tablet Place 0.4 mg under the tongue every 5 (five) minutes as needed for chest pain.     Omega-3 Fatty Acids (FISH OIL) 1000 MG CAPS Take 1,200 mg by mouth.      Turmeric (QC TUMERIC COMPLEX PO) Take 1,000 mg by mouth daily.     UNABLE TO FIND supplement 8 greens     VITAMIN D PO Take 1,000 Units by mouth.     FLUoxetine (PROZAC) 10 MG capsule Take by mouth daily.  0   montelukast (SINGULAIR) 10 MG tablet      No facility-administered medications prior to visit.     Allergies:   Amoxicillin   Social History   Socioeconomic History   Marital status: Divorced    Spouse name: Not on file   Number of children: Not on file   Years of education: Not on file   Highest education level: Not on file  Occupational History   Not on file  Tobacco Use   Smoking status: Never   Smokeless tobacco: Never  Substance and Sexual Activity   Alcohol use: No   Drug use: No   Sexual activity: Not Currently    Birth control/protection: Post-menopausal  Other Topics Concern   Not on file  Social History Narrative   Not on file   Social Determinants of Health   Financial Resource Strain: Not on file  Food Insecurity: Not on file  Transportation Needs: Not on file  Physical Activity: Not on file  Stress: Not on file  Social Connections: Not on file    Additional social history is notable in that she is divorced for 8 years.  She has 2 children and 2 grandchildren.  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  There is no history of tobacco use or alcohol use.  She exercises at least 5 days per week.  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  Her father had suffered an initial MI at age 79. She has 2 sisters who are ages 29 and 20.  ROS General: Negative; No fevers, chills, or night sweats;  HEENT: Negative; No changes in vision or hearing, sinus congestion, difficulty swallowing Pulmonary: Negative; No cough, wheezing, shortness of breath, hemoptysis Cardiovascular: See history of present illness GI: Negative; No nausea, vomiting, diarrhea, or abdominal pain GU: Negative; No dysuria, hematuria, or difficulty voiding Musculoskeletal: Positive for rheumatoid arthritis Hematologic/Oncology: Negative; no easy bruising, bleeding Endocrine: Negative; no heat/cold intolerance; no diabetes Neuro: Negative; no changes in balance, headaches Skin: Negative; No rashes or skin lesions Psychiatric: Negative; No behavioral problems, depression Sleep: Negative; No snoring, daytime sleepiness, hypersomnolence, bruxism, restless legs, hypnogognic hallucinations, no cataplexy Other comprehensive 14 point system review is negative.   PHYSICAL EXAM:   VS:  BP 106/74   Pulse 67   Ht _0  (1.702 m)   Wt 149 lb (67.6 kg)   LMP 08/10/1998 (Exact Date)   BMI 23.34 kg/m     Blood pressure by me was 128/74.  She states her blood pressure at home typically is around 116/68.  Wt Readings from Last 3 Encounters:  07/03/21 149 lb (67.6 kg)  04/15/21 150 lb (68 kg)  04/11/21 150 lb (68 kg)    General: Alert, oriented, no distress.  Skin: normal turgor, no rashes, warm and dry HEENT: Normocephalic, atraumatic. Pupils equal round and reactive to light; sclera anicteric; extraocular muscles intact;  Nose without nasal septal hypertrophy Mouth/Parynx benign; Mallinpatti scale 2 Neck: No JVD, no carotid bruits; normal carotid upstroke Lungs: clear to ausculatation and percussion; no wheezing or rales Chest wall: without tenderness to palpitation Heart: PMI not displaced, RRR, s1 s2 normal, 1/6 systolic murmur, no  diastolic murmur, no rubs, gallops, thrills, or heaves Abdomen: soft, nontender; no hepatosplenomehaly, BS+; abdominal aorta nontender and not dilated by palpation. Back: no CVA tenderness Pulses 2+ Musculoskeletal: full range of motion, normal strength, no joint deformities Extremities: no clubbing cyanosis or edema, Homan's sign negative  Neurologic: grossly nonfocal; Cranial nerves grossly wnl Psychologic: Normal mood and affect    Studies/Labs Reviewed:   ECG (independently read by me): NSR at 67; no ectopy;  QTc interval 431 Barbara.  April 11, 2021 ECG (independently read by me): Sinus bradycardia at 53; no ectopy, normal intervals  Apr 30, 2020 2021 ECG (independently read by me): Normal sinus rhythm at 65 bpm.  No ectopy.  Normal intervals.  No ST segment changes.  October 17, 2018 ECG (independently read by me): Sinus bradycardia 58 bpm.  Normal intervals.  No ectopy.  No significant ST-T abnormalities.  September 14, 2017 ECG (independently read by me): normal sinus rhythm at 72 bpm.  Normal intervals.  Mild RV conduction delay.  No significant ST T abnormalities.  Recent Labs: BMP Latest Ref Rng & Units 04/11/2021 09/16/2017 10/08/2014  Glucose 65 - 99 mg/dL 85 81 82  BUN 8 - 27 mg/dL _1 Creatinine 0.57 - 1.00 mg/dL 0.82 0.82 0.89  BUN/Creat Ratio 12 - 28 29(H) 22 -  Sodium 134 - 144 mmol/L 144 140 141  Potassium 3.5 - 5.2 mmol/L 4.3 4.3 4.8  Chloride 96 - 106 mmol/L 105 101 103  CO2 20 - 29 mmol/L _0 Calcium 8.7 - 10.3 mg/dL 9.2 9.2 9.7     Hepatic Function Latest Ref Rng & Units 04/11/2021 09/16/2017  Total Protein 6.0 - 8.5 g/dL 7.7 7.3  Albumin 3.8 - 4.8 g/dL 4.8 4.4  AST 0 - 40 IU/L 25 28  ALT 0 - 32 IU/L 18 22  Alk Phosphatase 44 - 121 IU/L 79 88  Total Bilirubin 0.0 - 1.2 mg/dL 0.5 0.7    CBC Latest Ref Rng & Units 04/11/2021 09/16/2017 10/14/2016  WBC 3.4 - 10.8 x10E3/uL 3.6 4.3 4.1  Hemoglobin 11.1 - 15.9 g/dL 13.5 13.1 14.1  Hematocrit 34.0 -  46.6 % 39.7 39.0 42.6  Platelets 150 - 450 x10E3/uL 247 239 229   Lab Results  Component Value Date   MCV 99 (H) 04/11/2021   MCV 95 09/16/2017   MCV 98.8 10/14/2016   Lab Results  Component Value Date   TSH 3.120 04/11/2021   Lab Results  Component Value Date   HGBA1C 5.4 10/23/2019     BNP No results found for: BNP  ProBNP No results found for: PROBNP   Lipid Panel     Component Value Date/Time   CHOL 138 04/11/2021 1041   TRIG 52 04/11/2021 1041   HDL 67 04/11/2021 1041   CHOLHDL 2.1 04/11/2021 1041   CHOLHDL 3.0 10/14/2016 1410   VLDL 12 10/14/2016 1410   LDLCALC 59 04/11/2021 1041     RADIOLOGY: No results found.   Additional studies/ records that were reviewed today include:  I [facility] [place].  I reviewed the patient's noninvasive studies and cardiac catheterization and PCI records.   ECHO 05/06/2021 IMPRESSIONS   1. Left ventricular ejection fraction, by estimation, is 60 to 65%. The  left ventricle has normal function. The left ventricle has no regional  wall motion abnormalities. There is mild concentric left ventricular  hypertrophy. Left ventricular diastolic  parameters are indeterminate.   2. Right ventricular systolic function is normal. The right ventricular  size is normal.   3. The mitral valve is normal in structure. Mild mitral valve  regurgitation. No evidence of mitral stenosis.   4. The aortic valve was not well visualized. Aortic valve regurgitation  is mild. No aortic stenosis is present.   5. Aortic dilatation noted. There is borderline dilatation of the aortic  root.   ASSESSMENT:    1. Palpitations   2. CAD S/P percutaneous coronary angioplasty   3. Hyperlipidemia with target LDL less than 70   4. Rheumatoid arthritis, involving unspecified site, unspecified whether rheumatoid factor present [facility])      PLAN:  Barbara Barbara Ayers is a very pleasant 54 -year-old  female who has a greater than 20 year history of rheumatoid arthritis and developed new onset exertional chest pressure which occurred while running in 2018.  She was found to have a 99% ostial LAD stenosis as the culprit lesion contributing to her symptomatology and underwent successful DES stenting with insertion of a Resolute Onyx 3.012 mm DES stent on 04/15/2017.  When I saw her for initial evaluation she was without recurrent anginal symptomatology and had resumed physical activity and denied any exertional symptoms.  On her initial stress test she also had experienced an episode of nonsustained VT.  She denies any palpitations.  She was not on any beta-blocker therapy and continued to be bradycardic.  Her physical exam suggested the possibility of mitral  valve prolapse which was not detected on 2D echo Doppler imaging.  When I saw her in October 2019 she had developed somewhat atypical upper left lateral pectoral chest discomfort without any exertional component.  She underwent a follow-up exercise Myoview study which was low risk.  She did not develop any chest pain or diagnostic ECG changes.  Mild thinning was noted in the anterior and apical anterior segment which was felt most likely due to breast attenuation.  Presently, she has continued to be without chest pain or shortness of breath.  I had an extensive discussion with her today and reviewed her Zio patch monitor which she had worn from April 21 through May 01, 2021 detail.  She had predominant sinus rhythm with the slowest heart rate at 44 bpm at 6 AM while sleeping and the fastest heart rate was 171 bpm which was a 4 beat episode of SVT.  She had numerous short bursts of SVT as noted above probable brief atrial tachycardic episodes with variable block.  There also was a brief atrial flutter episode.  Since I last saw her, she took metoprolol tartrate 25 mg on 1 occasion.  Recently she has remained completely asymptomatic.  We discussed the  possibility of initiating low-dose metoprolol succinate 12.5 mg daily but with her relative asymptomatic status and low blood pressure presently with current pulse in the 60s she prefers not to take a daily medication if at all possible.  Her blood pressure today is stable and at home typically runs around 116/68.  We discussed the importance of avoiding all caffeine which she tries to avoid is much as possible.  She continues to be on atorvastatin 40 mg for hyperlipidemia with target LDL less than 70.  She continues to be on methotrexate and Enbrel injections.  She continues to be on aspirin and low-dose Brilinta for continuation of DAPT.  She wakes up approximately 2 to occasionally 3 times per night.  She is unaware of snoring.  Denies any daytime sleepiness is admit to some fatigue.  I will see her in 6 months for follow-up evaluation or sooner as necessary.  She is to contact our office if she does not experience recurrent palpitations.     Medication Adjustments/Labs and Tests Ordered: Current medicines are reviewed at length with the patient today.  Concerns regarding medicines are outlined above.  Medication changes, Labs and Tests ordered today are listed in the Patient Instructions below.  Patient Instructions  Medication Instructions:  Your physician recommends that you continue on your current medications as directed. Please refer to the Current Medication list given to you today.  *If you need a refill on your cardiac medications before your next appointment, please call your pharmacy*  Follow-Up: At [facility], you and your health needs are our priority.  As part of our continuing mission to provide you with exceptional heart care, we have created designated Provider Care Teams.  These Care Teams include your primary Cardiologist (physician) and Advanced Practice Providers (APPs -  Physician Assistants and Nurse Practitioners) who all work together to provide you with the care you  need, when you need it.  We recommend signing up for the patient portal called "MyChart".  Sign up information is provided on this After Visit Summary.  MyChart is used to connect with patients for Virtual Visits (Telemedicine).  Patients are able to view lab/test results, encounter notes, upcoming appointments, etc.  Non-urgent messages can be sent to your provider as well.   To  learn more about what you can do with MyChart, go to [email or web address].    Your next appointment:   6 month(s)  The format for your next appointment:   In Person  Provider:   Shelva Majestic, MD      Signed, Shelva Majestic, MD, [facility]  07/03/2021 7:40 PM    [place] [address], [place], [place], [place]  [address] Phone: [phone]

## 2021-07-17 ENCOUNTER — Telehealth: Payer: Self-pay | Admitting: Cardiovascular Disease

## 2021-07-17 NOTE — Telephone Encounter (Signed)
Pt is wanting to know if she should get her arteries checked due to issues breathing when she sits or moves around please advise.

## 2021-07-17 NOTE — Telephone Encounter (Signed)
Returned call to pt she states that since her appt she has had worsening SOB, palpitations and fatigue. She states that since her appt with TK she had had to take her metoprolol 4 times, she took one today.  She is to f/u in Jan, I am unable to schedule at this time. 1st available with APP is in  September, she did not want to schedule an appt. Please advise, what do we want to do now?

## 2021-07-24 NOTE — Telephone Encounter (Signed)
Called pt and scheduled an appointment with Edd Fabian, NP for further evaluations. Appointment scheduled for 8/9 at [facility] location. Pt also made aware of ED precaution should any new symptoms develop or worsen. Pt verbalized understanding.

## 2021-08-03 NOTE — Progress Notes (Signed)
Cardiology Office Note:    Date:  08/05/2021   ID:  Barbara Ayers, DOB 08-Nov-1951, MRN [id number]  PCP:  Shon Hale, MD   [facility] Providers Cardiologist:  Nicki Guadalajara, MD      Referring MD: Shon Hale, *   Evaluation of palpitations  History of Present Illness:    Barbara Ayers is a 70 y.o. female with a hx of rheumatoid arthritis, entrapment of left ulnar nerve, foot pain, palpitations, HLD and coronary artery disease status post cardiac catheterization 2018.  It showed a 99% ostial LAD.  She underwent PCI with DES x1 on 04/15/2017.  She had developed chest pressure while running.  She underwent follow-up stress testing and had an episode of NSVT.  She was bradycardic and was not started on beta-blocker therapy.  Echocardiogram 06/26/2021 showed normal LVEF and mild LVH.  She wore a cardiac event monitor 4/21-05/01/21 which showed predominant normal sinus rhythm, sinus bradycardia with lowest heart rate noted 44, 4 beats of SVT with maximum heart rate of 171 and numerous short bursts of SVT which is felt to be brief atrial tachycardia.  She also was seen to have a brief episode of atrial flutter.  She had taken metoprolol tartrate 25 mg on 1 occasion and was asymptomatic.  The possibility of starting metoprolol succinate 12.5 mg daily was discussed but given her pulse in the 60s and blood pressure was not started.  She was seen by Dr. Dr. Tresa Endo 07/03/2021 and her blood pressure was stable 116/68.  The importance of avoiding caffeine was discussed.  She continued to be on Brilinta and aspirin.  She contacted the nurse triage line on 07/17/2021.  She reported worsening shortness of breath, palpitations, and fatigue.  She reported that since seeing Dr. Tresa Endo she had to take her metoprolol 4 times.  She presents the clinic today for follow-up evaluation and states she continues to run or use her stationary bicycle daily.  She does 3 to 4 miles each day.  She reports that  with her increased physical activity she notices tightness between her shoulder blades and occasionally has left shoulder pain.  We reviewed her prior cardiac catheterization and lipid panels.  She reports she does not use any nitroglycerin and her symptoms resolved with rest.  She has had to use only occasional metoprolol for increased palpitations but does feel like they are more frequent.  She only uses her metoprolol for extended periods of palpitations which is increased in frequency.  We also reviewed her echocardiogram.  I will order a nuclear stress test, give her the mindfulness stress reduction sheet, encouraged her to do light stretching exercises along with range of motion for her neck.  We will have her follow-up after her stress test.  Today she denies chest pain, shortness of breath, lower extremity edema, fatigue, palpitations, melena, hematuria, hemoptysis, diaphoresis, weakness, presyncope, syncope, orthopnea, and PND.   Past Medical History:  Diagnosis Date   Chest pain    Pinched nerve in shoulder, left    RA (rheumatoid arthritis) (HCC)    Transfusion history     Past Surgical History:  Procedure Laterality Date   CARPAL TUNNEL RELEASE     CORONARY ANGIOPLASTY     stint   TENDON RELEASE Right 2006    Current Medications: Current Meds  Medication Sig   aspirin EC 81 MG tablet Take 81 mg by mouth daily.   atorvastatin (LIPITOR) 40 MG tablet TAKE 1 TABLET BY MOUTH  EVERY DAY   BRILINTA 60 MG TABS tablet TAKE 1 TABLET BY MOUTH TWICE A DAY   CALCIUM PO Take 600 mg by mouth daily.    Cetirizine HCl 10 MG CAPS Take by mouth.   ENBREL SURECLICK 50 MG/ML injection Inject into the skin once a week.   folic acid (FOLVITE) 1 MG tablet Take 1 mg by mouth daily.   methotrexate (RHEUMATREX) 2.5 MG tablet Take 12.5 mg by mouth once a week. Caution:Chemotherapy. Protect from light.   metoprolol tartrate (LOPRESSOR) 25 MG tablet TAKE 1 TABLET BY MOUTH AS NEEDED FOR PALPITATIONS    nitroGLYCERIN (NITROSTAT) 0.4 MG SL tablet Place 0.4 mg under the tongue every 5 (five) minutes as needed for chest pain.   Omega-3 Fatty Acids (FISH OIL) 1000 MG CAPS Take 1,200 mg by mouth.    Turmeric (QC TUMERIC COMPLEX PO) Take 1,000 mg by mouth daily.   UNABLE TO FIND supplement 8 greens   VITAMIN D PO Take 1,000 Units by mouth.     Allergies:   Amoxicillin   Social History   Socioeconomic History   Marital status: Divorced    Spouse name: Not on file   Number of children: Not on file   Years of education: Not on file   Highest education level: Not on file  Occupational History   Not on file  Tobacco Use   Smoking status: Never   Smokeless tobacco: Never  Substance and Sexual Activity   Alcohol use: No   Drug use: No   Sexual activity: Not Currently    Birth control/protection: Post-menopausal  Other Topics Concern   Not on file  Social History Narrative   Not on file   Social Determinants of Health   Financial Resource Strain: Not on file  Food Insecurity: Not on file  Transportation Needs: Not on file  Physical Activity: Not on file  Stress: Not on file  Social Connections: Not on file     Family History: The patient's family history includes Breast cancer (age of onset: 61) in her maternal grandmother; Diabetes in an other family member; Heart disease in her father, mother, and another family member; Heart failure in her mother; Lung cancer (age of onset: 38) in her mother. There is no history of Colon cancer, Pancreatic cancer, Stomach cancer, or Rectal cancer.  ROS:   Please see the history of present illness.     All other systems reviewed and are negative.   Risk Assessment/Calculations:           Physical Exam:    VS:  BP 108/68   Pulse 68   Ht 5\' 7"  (1.702 m)   Wt 152 lb 11.2 oz (69.3 kg)   LMP 08/10/1998 (Exact Date)   SpO2 99%   BMI 23.92 kg/m     Wt Readings from Last 3 Encounters:  08/05/21 152 lb 11.2 oz (69.3 kg)  07/03/21 149  lb (67.6 kg)  04/15/21 150 lb (68 kg)     GEN:  Well nourished, well developed in no acute distress HEENT: Normal NECK: No JVD; No carotid bruits LYMPHATICS: No lymphadenopathy CARDIAC:  RRR, no murmurs, rubs, gallops RESPIRATORY:  Clear to auscultation without rales, wheezing or rhonchi  ABDOMEN: Soft, non-tender, non-distended MUSCULOSKELETAL:  No edema; No deformity  SKIN: Warm and dry NEUROLOGIC:  Alert and oriented x 3 PSYCHIATRIC:  Normal affect    EKGs/Labs/Other Studies Reviewed:    The following studies were reviewed today:  Echocardiogram 06/26/2021 IMPRESSIONS  1. Left ventricular ejection fraction, by estimation, is 60 to 65%. The  left ventricle has normal function. The left ventricle has no regional  wall motion abnormalities. There is mild concentric left ventricular  hypertrophy. Left ventricular diastolic  parameters are indeterminate.   2. Right ventricular systolic function is normal. The right ventricular  size is normal.   3. The mitral valve is normal in structure. Mild mitral valve  regurgitation. No evidence of mitral stenosis.   4. The aortic valve was not well visualized. Aortic valve regurgitation  is mild. No aortic stenosis is present.   5. Aortic dilatation noted. There is borderline dilatation of the aortic  root.   Cardiac event monitor 05/06/2021  The predominant rhythm was sinus rhythm with an average heart rate at 71 bpm.  The slowest heart rate was sinus bradycardia at 44 bpm on May 3 at 6 AM.  The maximum heart rate was 171 bpm.  Patient had 118 short bursts of SVT with the run with the fastest interval lasting 4 beats with a maximum rate of 171 bpm.  The longest episode lasted 11.6 seconds with an average rate of 136.  Some episodes of SVT may possibly be atrial tachycardia with variable block.  Atrial flutter occurred with less than 1% burden ranging from 100 to 151 bpm with average rate of 139 bpm, the longest lasts did 5 minutes and  28 seconds with an average rate of 139 bpm.  There were rare isolated PACs atrial couplets oratrial triplets.  There were rare PVCs.   When seen in the office in April, the patient was given a prescription for metoprolol titrate to take 25 to 50 mg on a as needed basis.  If she is continuing to experience these on a very frequent basis, consider initiatingmetoprolol succinate 25 mg daily but hold if resting pulse is less than 55  EKG:  EKG is  ordered today.  The ekg ordered today demonstrates normal sinus rhythm 68 bpm  Recent Labs: 04/11/2021: ALT 18; BUN 24; Creatinine, Ser 0.82; Hemoglobin 13.5; Magnesium 2.4; Platelets 247; Potassium 4.3; Sodium 144; TSH 3.120  Recent Lipid Panel    Component Value Date/Time   CHOL 138 04/11/2021 1041   TRIG 52 04/11/2021 1041   HDL 67 04/11/2021 1041   CHOLHDL 2.1 04/11/2021 1041   CHOLHDL 3.0 10/14/2016 1410   VLDL 12 10/14/2016 1410   LDLCALC 59 04/11/2021 1041    ASSESSMENT & PLAN    Palpitations-reports slight increased frequency of palpitations and use of metoprolol.  Previously discussed initiating metoprolol succinate 12.5 mg daily however, it was decided against due to her blood pressure and heart rate.  She was continued on metoprolol to tartrate 25.  Wore cardiac event monitor 5/22 details above. Continue metoprolol succinate as needed Heart healthy low-sodium diet-salty 6 given Increase physical activity as tolerated Avoid triggers for palpitations caffeine, chocolate, EtOH, dehydration etc.  Coronary artery disease-reports tightness between her shoulder blades with her runs and occasional episodes of left shoulder pain.  No recent episodes of arm back neck or chest discomfort.  Underwent cardiac catheterization 2018 with PCI and DES to her LAD. Continue Brilinta, aspirin, atorvastatin, omega-3 fatty acids, nitroglycerin Heart healthy low-sodium diet-salty 6 given Maintain physical activity Order nuclear stress test Mindfulness  stress reduction  Hyperlipidemia-04/11/2021: Cholesterol, Total 138; HDL 67; LDL Chol Calc (NIH) 59; Triglycerides 52 Continue atorvastatin, omega-3 fatty acids Heart healthy low-sodium high-fiber diet  Disposition: Follow-up with Dr. Tresa Endo or me after stress  test.  Shared Decision Making/Informed Consent  The risks [chest pain, shortness of breath, cardiac arrhythmias, dizziness, blood pressure fluctuations, myocardial infarction, stroke/transient ischemic attack, nausea, vomiting, allergic reaction, radiation exposure, metallic taste sensation and life-threatening complications (estimated to be 1 in 10,000)], benefits (risk stratification, diagnosing coronary artery disease, treatment guidance) and alternatives of a nuclear stress test were discussed in detail with Ms. Rogelio Seen and she agrees to proceed.    Medication Adjustments/Labs and Tests Ordered: Current medicines are reviewed at length with the patient today.  Concerns regarding medicines are outlined above.  No orders of the defined types were placed in this encounter.  No orders of the defined types were placed in this encounter.   There are no Patient Instructions on file for this visit.   Signed, Ronney Asters, NP  08/05/2021 10:15 AM      Notice: This dictation was prepared with Dragon dictation along with smaller phrase technology. Any transcriptional errors that result from this process are unintentional and may not be corrected upon review.  I spent 15 minutes examining this patient, reviewing medications, and using patient centered shared decision making involving her cardiac care.  Prior to her visit I spent greater than 20 minutes reviewing her past medical history,  medications, and prior cardiac tests.

## 2021-08-05 ENCOUNTER — Encounter (HOSPITAL_BASED_OUTPATIENT_CLINIC_OR_DEPARTMENT_OTHER): Payer: Self-pay | Admitting: General Practice

## 2021-08-05 ENCOUNTER — Other Ambulatory Visit: Payer: Self-pay

## 2021-08-05 ENCOUNTER — Ambulatory Visit (INDEPENDENT_AMBULATORY_CARE_PROVIDER_SITE_OTHER): Payer: Medicare Other | Admitting: General Practice

## 2021-08-05 ENCOUNTER — Encounter (HOSPITAL_BASED_OUTPATIENT_CLINIC_OR_DEPARTMENT_OTHER): Payer: Self-pay | Admitting: Cardiovascular Disease

## 2021-08-05 VITALS — BP 108/68 | HR 68 | Ht 67.0 in | Wt 152.7 lb

## 2021-08-05 DIAGNOSIS — Z9861 Coronary angioplasty status: Secondary | ICD-10-CM | POA: Diagnosis not present

## 2021-08-05 DIAGNOSIS — E785 Hyperlipidemia, unspecified: Secondary | ICD-10-CM

## 2021-08-05 DIAGNOSIS — R002 Palpitations: Secondary | ICD-10-CM | POA: Diagnosis not present

## 2021-08-05 DIAGNOSIS — I2510 Atherosclerotic heart disease of native coronary artery without angina pectoris: Secondary | ICD-10-CM | POA: Diagnosis not present

## 2021-08-05 NOTE — Patient Instructions (Addendum)
Medication Instructions:   Continue taking Metoprolol as needed for palpitations.  *If you need a refill on your cardiac medications before your next appointment, please call your pharmacy*    Testing/Procedures:  Your caregiver has ordered a Stress Test with nuclear imaging. The purpose of this test is to evaluate the blood supply to your heart muscle. This procedure is referred to as a "Non-Invasive Stress Test." This is because other than having an IV started in your vein, nothing is inserted or "invades" your body. Cardiac stress tests are done to find areas of poor blood flow to the heart by determining the extent of coronary artery disease (CAD). Some patients exercise on a treadmill, which naturally increases the blood flow to your heart, while others who are  unable to walk on a treadmill due to physical limitations have a pharmacologic/chemical stress agent called Lexiscan . This medicine will mimic walking on a treadmill by temporarily increasing your coronary blood flow.   Please note: these test may take anywhere between 2-4 hours to complete   Date of Procedure:_____________________________________  Arrival Time for Procedure:______________________________  Instructions regarding medication:   __N/A__ : Hold diabetes medication morning of procedure  _Do Not take your Metoprolol before your test___:  Hold betablocker(s) night before procedure and morning of procedure  ____:  Hold other medications as follows:___N/A______________________________________________________________________________________________________________________________________________________________________________________________________________________________________________________________________________________   How to prepare for your Myoview test:  Do not eat or drink after midnight No caffeine for 24 hours prior to test No smoking 24 hours prior to test. Your medication may be taken with  water.  If your doctor stopped a medication because of this test, do not take that medication. Ladies, please do not wear dresses.  Skirts or pants are appropriate. Please wear a short sleeve shirt. No perfume, cologne or lotion. Wear comfortable walking shoes. No heels!   Follow-Up: At [facility], you and your health needs are our priority.  As part of our continuing mission to provide you with exceptional heart care, we have created designated Provider Care Teams.  These Care Teams include your primary Cardiologist (physician) and Advanced Practice Providers (APPs -  Physician Assistants and Nurse Practitioners) who all work together to provide you with the care you need, when you need it.  We recommend signing up for the patient portal called "MyChart".  Sign up information is provided on this After Visit Summary.  MyChart is used to connect with patients for Virtual Visits (Telemedicine).  Patients are able to view lab/test results, encounter notes, upcoming appointments, etc.  Non-urgent messages can be sent to your provider as well.   To learn more about what you can do with MyChart, go to [email or web address].    Your next appointment:    Please follow-up after testing with Edd Fabian, FNP of Dr. Tresa Endo.   Other Instructions:  Mindfulness-Based Stress Reduction Mindfulness-based stress reduction (MBSR) is a program that helps people learn to practice mindfulness. Mindfulness is the practice of intentionally paying attention to the present moment. MBSR focuses on developing self-awareness, which allows you to respond to life stress without judgment or negative emotions. It can be learned and practiced through techniques such as education, breathing exercises, meditation, and yoga. MBSR includes several mindfulnesstechniques in one program. MBSR works best when you understand the treatment, are willing to try new things, and can commit to spending time practicing what you learn.  MBSR training may include learning about: How your emotions, thoughts, and reactions affect your body. New ways to respond to  things that cause negative thoughts to start (triggers). How to notice your thoughts and let go of them. Practicing awareness of everyday things that you normally do without thinking. The techniques and goals of different types of meditation. What are the benefits of MBSR? MBSR can have many benefits, which include helping you to: Develop self-awareness. This refers to knowing and understanding yourself. Learn skills and attitudes that help you to participate in your own health care. Learn new ways to care for yourself. Be more accepting about how things are, and let things go. Be less judgmental and approach things with an open mind. Be patient with yourself and trust yourself more. MBSR has also been shown to: Reduce negative emotions, such as depression and anxiety. Improve memory and focus. Change how you sense and approach pain. Boost your body's ability to fight infections. Help you connect better with other people. Improve your sense of well-being. Follow these instructions at home:  Find a local in-person or online MBSR program. Set aside some time regularly for mindfulness practice. Find a mindfulness practice that works best for you. This may include one or more of the following: Meditation. Meditation involves focusing your mind on a certain thought or activity. Breathing awareness exercises. These help you to stay present by focusing on your breath. Body scan. For this practice, you lie down and pay attention to each part of your body from head to toe. You can identify tension and soreness and intentionally relax parts of your body. Yoga. Yoga involves stretching and breathing, and it can improve your ability to move and be flexible. It can also provide an experience of testing your body's limits, which can help you release stress. Mindful eating.  This way of eating involves focusing on the taste, texture, color, and smell of each bite of food. Because this slows down eating and helps you feel full sooner, it can be an important part of a weight-loss plan. Find a podcast or recording that provides guidance for breathing awareness, body scan, or meditation exercises. You can listen to these any time when you have a free moment to rest without distractions. Follow your treatment plan as told by your health care provider. This may include taking regular medicines and making changes to your diet or lifestyle as recommended. How to practice mindfulness To do a basic awareness exercise: Find a comfortable place to sit. Pay attention to the present moment. Observe your thoughts, feelings, and surroundings just as they are. Avoid placing judgment on yourself, your feelings, or your surroundings. Make note of any judgment that comes up, and let it go. Your mind may wander, and that is okay. Make note of when your thoughts drift, and return your attention to the present moment. To do basic mindfulness meditation: Find a comfortable place to sit. This may include a stable chair or a firm floor cushion. Sit upright with your back straight. Let your arms fall next to your side with your hands resting on your legs. If sitting in a chair, rest your feet flat on the floor. If sitting on a cushion, cross your legs in front of you. Keep your head in a neutral position with your chin dropped slightly. Relax your jaw and rest the tip of your tongue on the roof of your mouth. Drop your gaze to the floor. You can close your eyes if you like. Breathe normally and pay attention to your breath. Feel the air moving in and out of your nose. Feel your belly  expanding and relaxing with each breath. Your mind may wander, and that is okay. Make note of when your thoughts drift, and return your attention to your breath. Avoid placing judgment on yourself, your feelings, or  your surroundings. Make note of any judgment or feelings that come up, let them go, and bring your attention back to your breath. When you are ready, lift your gaze or open your eyes. Pay attention to how your body feels after the meditation. Where to find more information You can find more information about MBSR from: Your health care provider. Community-based meditation centers or programs. Programs offered near you. Summary Mindfulness-based stress reduction (MBSR) is a program that teaches you how to intentionally pay attention to the present moment. It is used with other treatments to help you cope better with daily stress, emotions, and pain. MBSR focuses on developing self-awareness, which allows you to respond to life stress without judgment or negative emotions. MBSR programs may involve learning different mindfulness practices, such as breathing exercises, meditation, yoga, body scan, or mindful eating. Find a mindfulness practice that works best for you, and set aside time for it on a regular basis. This information is not intended to replace advice given to you by your health care provider. Make sure you discuss any questions you have with your healthcare provider. Document Revised: 08/30/2020 Document Reviewed: 08/30/2020 Elsevier Patient Education  2022 [employer].

## 2021-08-07 ENCOUNTER — Telehealth (HOSPITAL_COMMUNITY): Payer: Self-pay | Admitting: *Deleted

## 2021-08-07 NOTE — Telephone Encounter (Signed)
Close encounter 

## 2021-08-12 ENCOUNTER — Other Ambulatory Visit: Payer: Self-pay

## 2021-08-12 ENCOUNTER — Ambulatory Visit (HOSPITAL_COMMUNITY)
Admission: RE | Admit: 2021-08-12 | Discharge: 2021-08-12 | Disposition: A | Discharge status: Home / Self Care | Payer: Medicare Other | Source: Ambulatory Visit | Attending: Cardiology | Admitting: Cardiology

## 2021-08-12 DIAGNOSIS — Z9861 Coronary angioplasty status: Secondary | ICD-10-CM | POA: Diagnosis not present

## 2021-08-12 DIAGNOSIS — I2510 Atherosclerotic heart disease of native coronary artery without angina pectoris: Secondary | ICD-10-CM

## 2021-08-12 DIAGNOSIS — R002 Palpitations: Secondary | ICD-10-CM | POA: Insufficient documentation

## 2021-08-12 LAB — MYOCARDIAL PERFUSION IMAGING
LV dias vol: 77 mL (ref 46–106)
LV sys vol: 36 mL
Peak HR: 117 {beats}/min
Rest HR: 85 {beats}/min
SDS: 0
SRS: 0
SSS: 0
TID: 1.84

## 2021-08-12 MED ORDER — TECHNETIUM TC 99M TETROFOSMIN IV KIT
10.7000 | PACK | Freq: Once | INTRAVENOUS | Status: AC | PRN
  Administered 2021-08-12: 10.7 via INTRAVENOUS
  Filled 2021-08-12: qty 11

## 2021-08-12 MED ORDER — AMINOPHYLLINE 25 MG/ML IV SOLN
75.0000 mg | Freq: Once | INTRAVENOUS | Status: AC
Start: 2021-08-12 — End: 2021-08-12
  Administered 2021-08-12: 75 mg via INTRAVENOUS

## 2021-08-12 MED ORDER — TECHNETIUM TC 99M TETROFOSMIN IV KIT
30.8000 | PACK | Freq: Once | INTRAVENOUS | Status: AC | PRN
  Administered 2021-08-12: 30.8 via INTRAVENOUS
  Filled 2021-08-12: qty 31

## 2021-08-12 MED ORDER — REGADENOSON 0.4 MG/5ML IV SOLN
0.4000 mg | Freq: Once | INTRAVENOUS | Status: AC
Start: 2021-08-12 — End: 2021-08-12
  Administered 2021-08-12: 0.4 mg via INTRAVENOUS

## 2021-08-18 DIAGNOSIS — Z20822 Contact with and (suspected) exposure to covid-19: Secondary | ICD-10-CM | POA: Diagnosis not present

## 2021-08-18 NOTE — Progress Notes (Signed)
Cardiology Office Note:    Date:  08/18/2021   ID:  Aubriauna, Riner Dec 07, 1951, MRN [id number]  PCP:  Shon Hale, MD   [facility] Providers Cardiologist:  Nicki Guadalajara, MD    Referring MD: Shon Hale, *   Follow-up for palpitations and to review stress test results  History of Present Illness:    NAVYA TIMMONS is a 70 y.o. female with a hx of rheumatoid arthritis, entrapment of left ulnar nerve, foot pain, palpitations, HLD and coronary artery disease status post cardiac catheterization 2018.  It showed a 99% ostial LAD.  She underwent PCI with DES x1 on 04/15/2017.  She had developed chest pressure while running.  She underwent follow-up stress testing and had an episode of NSVT.  She was bradycardic and was not started on beta-blocker therapy.  Echocardiogram 06/26/2021 showed normal LVEF and mild LVH.  She wore a cardiac event monitor 4/21-05/01/21 which showed predominant normal sinus rhythm, sinus bradycardia with lowest heart rate noted 44, 4 beats of SVT with maximum heart rate of 171 and numerous short bursts of SVT which is felt to be brief atrial tachycardia.  She also was seen to have a brief episode of atrial flutter.  She had taken metoprolol tartrate 25 mg on 1 occasion and was asymptomatic.  The possibility of starting metoprolol succinate 12.5 mg daily was discussed but given her pulse in the 60s and blood pressure was not started.   She was seen by Dr. Dr. Tresa Endo 07/03/2021 and her blood pressure was stable 116/68.  The importance of avoiding caffeine was discussed.  She continued to be on Brilinta and aspirin.  She contacted the nurse triage line on 07/17/2021.  She reported worsening shortness of breath, palpitations, and fatigue.  She reported that since seeing Dr. Tresa Endo she had to take her metoprolol 4 times.   She presented the clinic 08/05/2021 for follow-up evaluation and stated she continued to run or use her stationary bicycle daily.  She did 3  to 4 miles each day.  She reported that with her increased physical activity she noticed tightness between her shoulder blades and occasionally had left shoulder pain.  We reviewed her prior cardiac catheterization and lipid panels.  She reported she did not use any nitroglycerin and her symptoms resolved with rest.  She  had to use only occasional metoprolol for increased palpitations but did feel like they were more frequent.  She only used her metoprolol for extended periods of palpitations.  We also reviewed her echocardiogram.  I will ordered a nuclear stress test, gave her the mindfulness stress reduction sheet, encouraged her to do light stretching exercises along with range of motion for her neck.  Her stress test showed LVEF 53%, no ST or T wave deviation and low risk 08/12/21.  She presents to the clinic today for follow-up evaluation states she ran 3 miles this morning and has not taken her metoprolol.  She also reports that she is not hydrating well.  On average she thinks she drinks around 3 cups of water per day.  She has stopped caffeine intake.  We reviewed her stress test and she expressed understanding.  We reviewed the importance of maintaining p.o. hydration.  She reports that her neck discomfort has resolved with the stretching exercises.  I will have her continue to avoid triggers for palpitations, have her start her metoprolol daily, and follow-up in 1 month.   Today she denies chest pain, shortness of  breath, lower extremity edema, fatigue, palpitations, melena, hematuria, hemoptysis, diaphoresis, weakness, presyncope, syncope, orthopnea, and PND.  Past Medical History:  Diagnosis Date   Chest pain    Pinched nerve in shoulder, left    RA (rheumatoid arthritis) (HCC)    Transfusion history     Past Surgical History:  Procedure Laterality Date   CARPAL TUNNEL RELEASE     CORONARY ANGIOPLASTY     stint   TENDON RELEASE Right 2006    Current Medications: No outpatient  medications have been marked as taking for the 08/19/21 encounter (Appointment) with Ronney Asters, NP.     Allergies:   Amoxicillin   Social History   Socioeconomic History   Marital status: Divorced    Spouse name: Not on file   Number of children: Not on file   Years of education: Not on file   Highest education level: Not on file  Occupational History   Not on file  Tobacco Use   Smoking status: Never   Smokeless tobacco: Never  Substance and Sexual Activity   Alcohol use: No   Drug use: No   Sexual activity: Not Currently    Birth control/protection: Post-menopausal  Other Topics Concern   Not on file  Social History Narrative   Not on file   Social Determinants of Health   Financial Resource Strain: Not on file  Food Insecurity: Not on file  Transportation Needs: Not on file  Physical Activity: Not on file  Stress: Not on file  Social Connections: Not on file     Family History: The patient's family history includes Breast cancer (age of onset: 27) in her maternal grandmother; Diabetes in an other family member; Heart disease in her father, mother, and another family member; Heart failure in her mother; Lung cancer (age of onset: 41) in her mother. There is no history of Colon cancer, Pancreatic cancer, Stomach cancer, or Rectal cancer.  ROS:   Please see the history of present illness.     All other systems reviewed and are negative.   Risk Assessment/Calculations:           Physical Exam:    VS:  LMP 08/10/1998 (Exact Date)     Wt Readings from Last 3 Encounters:  08/12/21 152 lb (68.9 kg)  08/05/21 152 lb 11.2 oz (69.3 kg)  07/03/21 149 lb (67.6 kg)     GEN: Well nourished, well developed in no acute distress HEENT: Normal NECK: No JVD; No carotid bruits LYMPHATICS: No lymphadenopathy CARDIAC: Sinus tachycardia first-degree AV block 130 bpm, no murmurs, rubs, gallops RESPIRATORY:  Clear to auscultation without rales, wheezing or rhonchi   ABDOMEN: Soft, non-tender, non-distended MUSCULOSKELETAL:  No edema; No deformity  SKIN: Warm and dry NEUROLOGIC:  Alert and oriented x 3 PSYCHIATRIC:  Normal affect    EKGs/Labs/Other Studies Reviewed:    Echocardiogram 06/26/2021 IMPRESSIONS     1. Left ventricular ejection fraction, by estimation, is 60 to 65%. The  left ventricle has normal function. The left ventricle has no regional  wall motion abnormalities. There is mild concentric left ventricular  hypertrophy. Left ventricular diastolic  parameters are indeterminate.   2. Right ventricular systolic function is normal. The right ventricular  size is normal.   3. The mitral valve is normal in structure. Mild mitral valve  regurgitation. No evidence of mitral stenosis.   4. The aortic valve was not well visualized. Aortic valve regurgitation  is mild. No aortic stenosis is present.  5. Aortic dilatation noted. There is borderline dilatation of the aortic  root.   The following studies were reviewed today: Nuclear stress test 08/12/2021 The left ventricular ejection fraction is mildly decreased (45-54%). Nuclear stress EF: 53%. The study is normal. This is a low risk study.  EKG:  EKG is  ordered today.  The ekg ordered today demonstrates sinus tachycardia first-degree AV block 130 bpm left axis deviation  Recent Labs: 04/11/2021: ALT 18; BUN 24; Creatinine, Ser 0.82; Hemoglobin 13.5; Magnesium 2.4; Platelets 247; Potassium 4.3; Sodium 144; TSH 3.120  Recent Lipid Panel    Component Value Date/Time   CHOL 138 04/11/2021 1041   TRIG 52 04/11/2021 1041   HDL 67 04/11/2021 1041   CHOLHDL 2.1 04/11/2021 1041   CHOLHDL 3.0 10/14/2016 1410   VLDL 12 10/14/2016 1410   LDLCALC 59 04/11/2021 1041    ASSESSMENT & PLAN    Coronary artery disease-no recent episodes of chest discomfort.  Appears that her next discomfort was musculoskeletal in nature.  Nuclear stress test 08/12/2021 showed EF 53% and low risk.  Test  results reviewed. Continue Brilinta, aspirin, atorvastatin, omega-3 fatty acids, nitroglycerin Heart healthy low-sodium diet-salty 6 given Increase physical activity as tolerated Continue neck mobility/stretching exercises  Tachycardia/palpitations-EKG today shows first-degree AV block with sinus tachycardia 130 bpm.  She continues to run every other day and reports that she does not hydrate well.  She has not taken her metoprolol today. Increase p.o. hydration Continue metoprolol 25 mg daily Maintain physical activity Avoid triggers-reviewed  Hyperlipidemia-04/11/2021: Cholesterol, Total 138; HDL 67; LDL Chol Calc (NIH) 59; Triglycerides 52 Continue heart healthy low-sodium high-fiber diet Continue atorvastatin, omega-3 fatty acids  Disposition: Follow-up Dr. Tresa Endo or me in 1 month.       Medication Adjustments/Labs and Tests Ordered: Current medicines are reviewed at length with the patient today.  Concerns regarding medicines are outlined above.  No orders of the defined types were placed in this encounter.  No orders of the defined types were placed in this encounter.   There are no Patient Instructions on file for this visit.   Signed, Ronney Asters, NP  08/18/2021 7:36 AM      Notice: This dictation was prepared with Dragon dictation along with smaller phrase technology. Any transcriptional errors that result from this process are unintentional and may not be corrected upon review.  I spent 13 minutes examining this patient, reviewing medications, and using patient centered shared decision making involving her cardiac care.  Prior to her visit I spent greater than 20 minutes reviewing her past medical history,  medications, and prior cardiac tests.

## 2021-08-19 ENCOUNTER — Ambulatory Visit (INDEPENDENT_AMBULATORY_CARE_PROVIDER_SITE_OTHER): Payer: Medicare Other | Admitting: General Practice

## 2021-08-19 ENCOUNTER — Other Ambulatory Visit: Payer: Self-pay

## 2021-08-19 ENCOUNTER — Encounter (HOSPITAL_BASED_OUTPATIENT_CLINIC_OR_DEPARTMENT_OTHER): Payer: Self-pay | Admitting: General Practice

## 2021-08-19 VITALS — BP 98/76 | HR 136 | Ht 67.0 in | Wt 151.4 lb

## 2021-08-19 DIAGNOSIS — I2510 Atherosclerotic heart disease of native coronary artery without angina pectoris: Secondary | ICD-10-CM

## 2021-08-19 DIAGNOSIS — R002 Palpitations: Secondary | ICD-10-CM

## 2021-08-19 DIAGNOSIS — E785 Hyperlipidemia, unspecified: Secondary | ICD-10-CM

## 2021-08-19 MED ORDER — METOPROLOL TARTRATE 25 MG PO TABS: 25.0000 mg | ORAL_TABLET | Freq: Every day | ORAL | 1 refills | Status: DC

## 2021-08-19 NOTE — Patient Instructions (Signed)
Medication Instructions:   CHANGE Metoprolol Tartrate 25 mg to taking 1 tablet daily.  *If you need a refill on your cardiac medications before your next appointment, please call your pharmacy*  Follow-Up: At [facility], you and your health needs are our priority.  As part of our continuing mission to provide you with exceptional heart care, we have created designated Provider Care Teams.  These Care Teams include your primary Cardiologist (physician) and Advanced Practice Providers (APPs -  Physician Assistants and Nurse Practitioners) who all work together to provide you with the care you need, when you need it.  We recommend signing up for the patient portal called "MyChart".  Sign up information is provided on this After Visit Summary.  MyChart is used to connect with patients for Virtual Visits (Telemedicine).  Patients are able to view lab/test results, encounter notes, upcoming appointments, etc.  Non-urgent messages can be sent to your provider as well.   To learn more about what you can do with MyChart, go to [email or web address].    Your next appointment:   1 month(s)  The format for your next appointment:   In Person  Provider:   Edd Fabian, FNP   Other Instructions Edd Fabian, FNP has recommended to increase your hydration. Please drink a minimum of 48-64 oz of fluid daily.  Palpitations Palpitations are feelings that your heartbeat is irregular or is faster than normal. It may feel like your heart is fluttering or skipping a beat. Palpitations are usually not a serious problem. They may be caused by many things, including smoking, caffeine, alcohol, stress, and certain medicines or drugs. Most causes of palpitations are not serious. However, some palpitations can be a sign of a serious problem. You may need further tests to rule outserious medical problems. Follow these instructions at home:     Pay attention to any changes in your condition. Take these  actions to helpmanage your symptoms: Eating and drinking Avoid foods and drinks that may cause palpitations. These may include: Caffeinated coffee, tea, soft drinks, diet pills, and energy drinks. Chocolate. Alcohol. Lifestyle Take steps to reduce your stress and anxiety. Things that can help you relax include: Yoga. Mind-body activities, such as deep breathing, meditation, or using words and images to create positive thoughts (guided imagery). Physical activity, such as swimming, jogging, or walking. Tell your health care provider if your palpitations increase with activity. If you have chest pain or shortness of breath with activity, do not continue the activity until you are seen by your health care provider. Biofeedback. This is a method that helps you learn to use your mind to control things in your body, such as your heartbeat. Do not use drugs, including cocaine or ecstasy. Do not use marijuana. Get plenty of rest and sleep. Keep a regular bed time. General instructions Take over-the-counter and prescription medicines only as told by your health care provider. Do not use any products that contain nicotine or tobacco, such as cigarettes and e-cigarettes. If you need help quitting, ask your health care provider. Keep all follow-up visits as told by your health care provider. This is important. These may include visits for further testing if palpitations do not go away or get worse. Contact a health care provider if you: Continue to have a fast or irregular heartbeat after 24 hours. Notice that your palpitations occur more often. Get help right away if you: Have chest pain or shortness of breath. Have a severe headache. Feel dizzy or you faint. Summary  Palpitations are feelings that your heartbeat is irregular or is faster than normal. It may feel like your heart is fluttering or skipping a beat. Palpitations may be caused by many things, including smoking, caffeine, alcohol, stress,  certain medicines, and drugs. Although most causes of palpitations are not serious, some causes can be a sign of a serious medical problem. Get help right away if you faint or have chest pain, shortness of breath, a severe headache, or dizziness. This information is not intended to replace advice given to you by your health care provider. Make sure you discuss any questions you have with your healthcare provider. Document Revised: 01/26/2018 Document Reviewed: 01/26/2018 Elsevier Patient Education  2022 [employer].

## 2021-09-16 DIAGNOSIS — M8580 Other specified disorders of bone density and structure, unspecified site: Secondary | ICD-10-CM | POA: Diagnosis not present

## 2021-09-16 DIAGNOSIS — I2510 Atherosclerotic heart disease of native coronary artery without angina pectoris: Secondary | ICD-10-CM | POA: Diagnosis not present

## 2021-09-16 DIAGNOSIS — M1990 Unspecified osteoarthritis, unspecified site: Secondary | ICD-10-CM | POA: Diagnosis not present

## 2021-09-16 DIAGNOSIS — G5600 Carpal tunnel syndrome, unspecified upper limb: Secondary | ICD-10-CM | POA: Diagnosis not present

## 2021-09-16 DIAGNOSIS — M0600 Rheumatoid arthritis without rheumatoid factor, unspecified site: Secondary | ICD-10-CM | POA: Diagnosis not present

## 2021-09-16 DIAGNOSIS — Z79899 Other long term (current) drug therapy: Secondary | ICD-10-CM | POA: Diagnosis not present

## 2021-09-16 DIAGNOSIS — M7070 Other bursitis of hip, unspecified hip: Secondary | ICD-10-CM | POA: Diagnosis not present

## 2021-09-23 NOTE — Progress Notes (Deleted)
Cardiology Office Note:    Date:  09/23/2021   ID:  Barbara Ayers, DOB 26-Jun-1951, MRN [id number]  PCP:  Shon Hale, MD   [facility] Providers Cardiologist:  Nicki Guadalajara, MD { Click to update primary MD,subspecialty MD or APP then REFRESH:1}    Referring MD: Shon Hale, *   Follow-up for palpitations  History of Present Illness:    Barbara Ayers is a 71 y.o. female with a hx of rheumatoid arthritis, entrapment of left ulnar nerve, foot pain, palpitations, HLD and coronary artery disease status post cardiac catheterization 2018.  It showed a 99% ostial LAD.  She underwent PCI with DES x1 on 04/15/2017.  She had developed chest pressure while running.  She underwent follow-up stress testing and had an episode of NSVT.  She was bradycardic and was not started on beta-blocker therapy.  Echocardiogram 06/26/2021 showed normal LVEF and mild LVH.  She wore a cardiac event monitor 4/21-05/01/21 which showed predominant normal sinus rhythm, sinus bradycardia with lowest heart rate noted 44, 4 beats of SVT with maximum heart rate of 171 and numerous short bursts of SVT which is felt to be brief atrial tachycardia.  She also was seen to have a brief episode of atrial flutter.  She had taken metoprolol tartrate 25 mg on 1 occasion and was asymptomatic.  The possibility of starting metoprolol succinate 12.5 mg daily was discussed but given her pulse in the 60s and blood pressure was not started.   She was seen by Dr. Dr. Tresa Endo 07/03/2021 and her blood pressure was stable 116/68.  The importance of avoiding caffeine was discussed.  She continued to be on Brilinta and aspirin.  She contacted the nurse triage line on 07/17/2021.  She reported worsening shortness of breath, palpitations, and fatigue.  She reported that since seeing Dr. Tresa Endo she had to take her metoprolol 4 times.   She presented the clinic 08/05/2021 for follow-up evaluation and stated she continued to run or use her  stationary bicycle daily.  She did 3 to 4 miles each day.  She reported that with her increased physical activity she noticed tightness between her shoulder blades and occasionally had left shoulder pain.  We reviewed her prior cardiac catheterization and lipid panels.  She reported she did not use any nitroglycerin and her symptoms resolved with rest.  She  had to use only occasional metoprolol for increased palpitations but did feel like they were more frequent.  She only used her metoprolol for extended periods of palpitations.  We also reviewed her echocardiogram.  I will ordered a nuclear stress test, gave her the mindfulness stress reduction sheet, encouraged her to do light stretching exercises along with range of motion for her neck.  Her stress test showed LVEF 53%, no ST or T wave deviation and low risk 08/12/21.   She presented to the clinic 08/19/2021 for follow-up evaluation stated she ran 3 miles that morning and had not taken her metoprolol.  She also reporeds that she was not hydrating well.  On average she thought she would drink around 3 cups of water per day.  She had stopped caffeine intake.  We reviewed her stress test and she expressed understanding.  We reviewed the importance of maintaining p.o. hydration.  She reported that her neck discomfort had resolved with  stretching exercises.  I asked her continue to avoid triggers for palpitations, asked her to start her metoprolol daily, and planned follow-up in 1 month.  She presents the clinic today for follow-up evaluation states***   Today she denies chest pain, shortness of breath, lower extremity edema, fatigue, palpitations, melena, hematuria, hemoptysis, diaphoresis, weakness, presyncope, syncope, orthopnea, and PND.  Past Medical History:  Diagnosis Date   Chest pain    Pinched nerve in shoulder, left    RA (rheumatoid arthritis) (HCC)    Transfusion history     Past Surgical History:  Procedure Laterality Date   CARPAL  TUNNEL RELEASE     CORONARY ANGIOPLASTY     stint   TENDON RELEASE Right 2006    Current Medications: No outpatient medications have been marked as taking for the 09/26/21 encounter (Appointment) with Ronney Asters, NP.     Allergies:   Amoxicillin   Social History   Socioeconomic History   Marital status: Divorced    Spouse name: Not on file   Number of children: Not on file   Years of education: Not on file   Highest education level: Not on file  Occupational History   Not on file  Tobacco Use   Smoking status: Never   Smokeless tobacco: Never  Substance and Sexual Activity   Alcohol use: No   Drug use: No   Sexual activity: Not Currently    Birth control/protection: Post-menopausal  Other Topics Concern   Not on file  Social History Narrative   Not on file   Social Determinants of Health   Financial Resource Strain: Not on file  Food Insecurity: Not on file  Transportation Needs: Not on file  Physical Activity: Not on file  Stress: Not on file  Social Connections: Not on file     Family History: The patient's ***family history includes Breast cancer (age of onset: 82) in her maternal grandmother; Diabetes in an other family member; Heart disease in her father, mother, and another family member; Heart failure in her mother; Lung cancer (age of onset: 25) in her mother. There is no history of Colon cancer, Pancreatic cancer, Stomach cancer, or Rectal cancer.  ROS:   Please see the history of present illness.    *** All other systems reviewed and are negative.   Risk Assessment/Calculations:   {Does this patient have ATRIAL FIBRILLATION?:[id number]}       Physical Exam:    VS:  LMP 08/10/1998 (Exact Date)     Wt Readings from Last 3 Encounters:  08/19/21 151 lb 6.4 oz (68.7 kg)  08/12/21 152 lb (68.9 kg)  08/05/21 152 lb 11.2 oz (69.3 kg)     GEN: *** Well nourished, well developed in no acute distress HEENT: Normal NECK: No JVD; No carotid  bruits LYMPHATICS: No lymphadenopathy CARDIAC: ***RRR, no murmurs, rubs, gallops RESPIRATORY:  Clear to auscultation without rales, wheezing or rhonchi  ABDOMEN: Soft, non-tender, non-distended MUSCULOSKELETAL:  No edema; No deformity  SKIN: Warm and dry NEUROLOGIC:  Alert and oriented x 3 PSYCHIATRIC:  Normal affect    EKGs/Labs/Other Studies Reviewed:    The following studies were reviewed today:  Echocardiogram 06/26/2021 IMPRESSIONS     1. Left ventricular ejection fraction, by estimation, is 60 to 65%. The  left ventricle has normal function. The left ventricle has no regional  wall motion abnormalities. There is mild concentric left ventricular  hypertrophy. Left ventricular diastolic  parameters are indeterminate.   2. Right ventricular systolic function is normal. The right ventricular  size is normal.   3. The mitral valve is normal in structure. Mild mitral valve  regurgitation. No evidence  of mitral stenosis.   4. The aortic valve was not well visualized. Aortic valve regurgitation  is mild. No aortic stenosis is present.   5. Aortic dilatation noted. There is borderline dilatation of the aortic  root.    The following studies were reviewed today: Nuclear stress test 08/12/2021 The left ventricular ejection fraction is mildly decreased (45-54%). Nuclear stress EF: 53%. The study is normal. This is a low risk study.   EKG:  EKG is  ordered today.  The ekg ordered today demonstrates sinus tachycardia first-degree AV block 130 bpm left axis deviation  EKG:  EKG is *** ordered today.  The ekg ordered today demonstrates ***  Recent Labs: 04/11/2021: ALT 18; BUN 24; Creatinine, Ser 0.82; Hemoglobin 13.5; Magnesium 2.4; Platelets 247; Potassium 4.3; Sodium 144; TSH 3.120  Recent Lipid Panel    Component Value Date/Time   CHOL 138 04/11/2021 1041   TRIG 52 04/11/2021 1041   HDL 67 04/11/2021 1041   CHOLHDL 2.1 04/11/2021 1041   CHOLHDL 3.0 10/14/2016 1410   VLDL  12 10/14/2016 1410   LDLCALC 59 04/11/2021 1041    ASSESSMENT & PLAN    Tachycardia/palpitations-heart rate today***.  She continues her running routine and is now hydrating much better.  She reports compliance with metoprolol and denies side effects.   Maintain p.o. hydration Continue metoprolol 25 mg daily Maintain physical activity Avoid triggers-again reviewed   Hyperlipidemia-04/11/2021: Cholesterol, Total 138; HDL 67; LDL Chol Calc (NIH) 59; Triglycerides 52 Continue heart healthy low-sodium high-fiber diet Continue atorvastatin, omega-3 fatty acids Repeat fasting lipids and LFTs in 4/23  Coronary artery disease-no chest pain today.  Denies recent episodes of arm neck back or chest discomfort.  Previously noted musculoskeletal neck pain. Nuclear stress test 08/12/2021 showed EF 53% and low risk.   Continue Brilinta, aspirin, atorvastatin, omega-3 fatty acids, nitroglycerin Heart healthy low-sodium diet-salty 6 given Maintain physical activity as tolerated Continue neck mobility/stretching exercises  Disposition: Follow-up Dr. Tresa Endo in 4 to 6 months  {Are you ordering a CV Procedure (e.g. stress test, cath, DCCV, TEE, etc)?   Press F2        :[id number]}    Medication Adjustments/Labs and Tests Ordered: Current medicines are reviewed at length with the patient today.  Concerns regarding medicines are outlined above.  No orders of the defined types were placed in this encounter.  No orders of the defined types were placed in this encounter.   There are no Patient Instructions on file for this visit.   Signed, Ronney Asters, NP  09/23/2021 7:56 AM      Notice: This dictation was prepared with Dragon dictation along with smaller phrase technology. Any transcriptional errors that result from this process are unintentional and may not be corrected upon review.  I spent***minutes examining this patient, reviewing medications, and using patient centered shared decision making  involving her cardiac care.  Prior to her visit I spent greater than 20 minutes reviewing her past medical history,  medications, and prior cardiac tests.

## 2021-09-24 ENCOUNTER — Ambulatory Visit: Payer: Medicare Other | Admitting: Neurology

## 2021-09-26 ENCOUNTER — Ambulatory Visit (INDEPENDENT_AMBULATORY_CARE_PROVIDER_SITE_OTHER): Payer: Medicare Other | Admitting: General Practice

## 2021-09-26 ENCOUNTER — Ambulatory Visit (HOSPITAL_BASED_OUTPATIENT_CLINIC_OR_DEPARTMENT_OTHER): Payer: Medicare Other | Admitting: General Practice

## 2021-09-26 ENCOUNTER — Other Ambulatory Visit: Payer: Self-pay

## 2021-09-26 ENCOUNTER — Encounter (HOSPITAL_BASED_OUTPATIENT_CLINIC_OR_DEPARTMENT_OTHER): Payer: Self-pay | Admitting: General Practice

## 2021-09-26 VITALS — BP 116/70 | HR 83 | Ht 67.0 in | Wt 153.0 lb

## 2021-09-26 DIAGNOSIS — I2510 Atherosclerotic heart disease of native coronary artery without angina pectoris: Secondary | ICD-10-CM

## 2021-09-26 DIAGNOSIS — E785 Hyperlipidemia, unspecified: Secondary | ICD-10-CM | POA: Diagnosis not present

## 2021-09-26 DIAGNOSIS — R002 Palpitations: Secondary | ICD-10-CM
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
## 2021-09-26 NOTE — Patient Instructions (Signed)
Medication Instructions:  Continue current medications  *If you need a refill on your cardiac medications before your next appointment, please call your pharmacy*   Lab Work: None Ordered   Testing/Procedures: None Ordered   Follow-Up: At [facility], you and your health needs are our priority.  As part of our continuing mission to provide you with exceptional heart care, we have created designated Provider Care Teams.  These Care Teams include your primary Cardiologist (physician) and Advanced Practice Providers (APPs -  Physician Assistants and Nurse Practitioners) who all work together to provide you with the care you need, when you need it.  We recommend signing up for the patient portal called "MyChart".  Sign up information is provided on this After Visit Summary.  MyChart is used to connect with patients for Virtual Visits (Telemedicine).  Patients are able to view lab/test results, encounter notes, upcoming appointments, etc.  Non-urgent messages can be sent to your provider as well.   To learn more about what you can do with MyChart, go to [email or web address].com.    Your next appointment:   6 month(s)  The format for your next appointment:   In Person  Provider:   You may see Kymberlie Brazeau Kelly, MD or one of the following Advanced Practice Providers on your designated Care Team:    Hao Meng, PA-C  Angela Duke, PA-C or   Krista Kroeger, PA-C     

## 2021-10-13 ENCOUNTER — Telehealth: Payer: Self-pay | Admitting: Cardiovascular Disease

## 2021-10-13 DIAGNOSIS — R0609 Other forms of dyspnea: Secondary | ICD-10-CM

## 2021-10-13 NOTE — Telephone Encounter (Signed)
Spoke to pt. She report she continues to have SOB with exertion. She [place] barely [place]. She also state her legs felt heavy. Pt state this is not her norm and wanted to know if there was any other cardiac test Verdon Cummins or Dr. Tresa Endo recommend to reassure her that's nothing related to her heart. Pt denies swelling, CP, significant weight change, or any other symptoms.   Will route to NP for recommendations

## 2021-10-13 NOTE — Telephone Encounter (Signed)
Patient called wanting to speak with the nurse. Please advise 

## 2021-10-15 NOTE — Telephone Encounter (Signed)
Pt updated with NP's recommendations and verbalized understanding. Order placed and sent to scheduling.   Barbara Ayers,   Please order a echocardiogram for increased dyspnea on exertion/coronary artery disease.  This will allow [place] to reevaluate her LV function and review if there have been any changes with her valves.   Thank you,   Thomasene Ripple. Cleaver NP-C

## 2021-10-20 DIAGNOSIS — J988 Other specified respiratory disorders: Secondary | ICD-10-CM | POA: Diagnosis not present

## 2021-10-20 DIAGNOSIS — R059 Cough, unspecified: Secondary | ICD-10-CM | POA: Diagnosis not present

## 2021-10-21 NOTE — Telephone Encounter (Signed)
ECHOCARDIOGRAM:  10/31/2021 Time: 8:20 AM [facility] ECHO 2

## 2021-10-31 ENCOUNTER — Ambulatory Visit (HOSPITAL_COMMUNITY): Payer: Medicare Other | Attending: Internal Medicine

## 2021-10-31 ENCOUNTER — Other Ambulatory Visit: Payer: Self-pay

## 2021-10-31 DIAGNOSIS — I2510 Atherosclerotic heart disease of native coronary artery without angina pectoris: Secondary | ICD-10-CM | POA: Diagnosis not present

## 2021-10-31 DIAGNOSIS — R0609 Other forms of dyspnea: Secondary | ICD-10-CM | POA: Diagnosis not present

## 2021-10-31 LAB — ECHOCARDIOGRAM COMPLETE
Area-P 1/2: 3.65 cm2
S' Lateral: 2.8 cm

## 2021-12-16 DIAGNOSIS — Z79899 Other long term (current) drug therapy: Secondary | ICD-10-CM | POA: Diagnosis not present

## 2021-12-16 DIAGNOSIS — M0579 Rheumatoid arthritis with rheumatoid factor of multiple sites without organ or systems involvement: Secondary | ICD-10-CM | POA: Diagnosis not present

## 2021-12-25 DIAGNOSIS — H2513 Age-related nuclear cataract, bilateral: Secondary | ICD-10-CM | POA: Diagnosis not present

## 2021-12-25 DIAGNOSIS — H10413 Chronic giant papillary conjunctivitis, bilateral: Secondary | ICD-10-CM | POA: Diagnosis not present

## 2021-12-25 DIAGNOSIS — H5213 Myopia, bilateral: Secondary | ICD-10-CM | POA: Diagnosis not present

## 2021-12-25 DIAGNOSIS — H04123 Dry eye syndrome of bilateral lacrimal glands: Secondary | ICD-10-CM | POA: Diagnosis not present

## 2022-01-01 ENCOUNTER — Ambulatory Visit: Payer: Medicare Other | Admitting: Cardiovascular Disease

## 2022-01-12 DIAGNOSIS — Z1231 Encounter for screening mammogram for malignant neoplasm of breast: Secondary | ICD-10-CM | POA: Diagnosis not present

## 2022-01-13 ENCOUNTER — Encounter: Payer: Self-pay | Admitting: Nurse Practitioner

## 2022-01-30 ENCOUNTER — Emergency Department (HOSPITAL_BASED_OUTPATIENT_CLINIC_OR_DEPARTMENT_OTHER): Payer: Medicare Other

## 2022-01-30 ENCOUNTER — Other Ambulatory Visit: Payer: Self-pay

## 2022-01-30 ENCOUNTER — Emergency Department (HOSPITAL_BASED_OUTPATIENT_CLINIC_OR_DEPARTMENT_OTHER)
Admission: EM | Admit: 2022-01-30 | Discharge: 2022-01-31 | Disposition: A | Discharge status: Home / Self Care | Payer: Medicare Other | Attending: Emergency Medicine | Admitting: Emergency Medicine

## 2022-01-30 ENCOUNTER — Encounter (HOSPITAL_BASED_OUTPATIENT_CLINIC_OR_DEPARTMENT_OTHER): Payer: Self-pay | Admitting: *Deleted

## 2022-01-30 DIAGNOSIS — R059 Cough, unspecified: Secondary | ICD-10-CM | POA: Diagnosis not present

## 2022-01-30 DIAGNOSIS — Z79899 Other long term (current) drug therapy: Secondary | ICD-10-CM | POA: Insufficient documentation

## 2022-01-30 DIAGNOSIS — J188 Other pneumonia, unspecified organism: Secondary | ICD-10-CM | POA: Diagnosis not present

## 2022-01-30 DIAGNOSIS — Z7982 Long term (current) use of aspirin: Secondary | ICD-10-CM | POA: Diagnosis not present

## 2022-01-30 DIAGNOSIS — J189 Pneumonia, unspecified organism: Secondary | ICD-10-CM

## 2022-01-30 DIAGNOSIS — J479 Bronchiectasis, uncomplicated: Secondary | ICD-10-CM | POA: Diagnosis not present

## 2022-01-30 DIAGNOSIS — Z20822 Contact with and (suspected) exposure to covid-19: Secondary | ICD-10-CM | POA: Diagnosis not present

## 2022-01-30 DIAGNOSIS — R002 Palpitations: Secondary | ICD-10-CM | POA: Insufficient documentation

## 2022-01-30 DIAGNOSIS — R0602 Shortness of breath: Secondary | ICD-10-CM | POA: Insufficient documentation

## 2022-01-30 DIAGNOSIS — J069 Acute upper respiratory infection, unspecified: Secondary | ICD-10-CM | POA: Diagnosis not present

## 2022-01-30 LAB — CBC WITH DIFFERENTIAL/PLATELET
Abs Immature Granulocytes: 0.02 10*3/uL (ref 0.00–0.07)
Basophils Absolute: 0 10*3/uL (ref 0.0–0.1)
Basophils Relative: 0 %
Eosinophils Absolute: 0 10*3/uL (ref 0.0–0.5)
Eosinophils Relative: 0 %
HCT: 38.3 % (ref 36.0–46.0)
Hemoglobin: 13 g/dL (ref 12.0–15.0)
Immature Granulocytes: 0 %
Lymphocytes Relative: 14 %
Lymphs Abs: 1 10*3/uL (ref 0.7–4.0)
MCH: 33.3 pg (ref 26.0–34.0)
MCHC: 33.9 g/dL (ref 30.0–36.0)
MCV: 98.2 fL (ref 80.0–100.0)
Monocytes Absolute: 0.6 10*3/uL (ref 0.1–1.0)
Monocytes Relative: 8 %
Neutro Abs: 5.5 10*3/uL (ref 1.7–7.7)
Neutrophils Relative %: 78 %
Platelets: 163 10*3/uL (ref 150–400)
RBC: 3.9 MIL/uL (ref 3.87–5.11)
RDW: 13.6 % (ref 11.5–15.5)
WBC: 7.1 10*3/uL (ref 4.0–10.5)
nRBC: 0 % (ref 0.0–0.2)

## 2022-01-30 LAB — COMPREHENSIVE METABOLIC PANEL
ALT: 12 U/L (ref 0–44)
AST: 22 U/L (ref 15–41)
Albumin: 4.2 g/dL (ref 3.5–5.0)
Alkaline Phosphatase: 49 U/L (ref 38–126)
Anion gap: 11 (ref 5–15)
BUN: 19 mg/dL (ref 8–23)
CO2: 25 mmol/L (ref 22–32)
Calcium: 8.9 mg/dL (ref 8.9–10.3)
Chloride: 102 mmol/L (ref 98–111)
Creatinine, Ser: 0.82 mg/dL (ref 0.44–1.00)
GFR, Estimated: >60 mL/min (ref >60)
Glucose, Bld: 108 mg/dL — ABNORMAL HIGH (ref 70–99)
Potassium: 3.3 mmol/L — ABNORMAL LOW (ref 3.5–5.1)
Sodium: 138 mmol/L (ref 135–145)
Total Bilirubin: 0.9 mg/dL (ref 0.3–1.2)
Total Protein: 7.4 g/dL (ref 6.5–8.1)

## 2022-01-30 LAB — RESP PANEL BY RT-PCR (FLU A&B, COVID) ARPGX2
Influenza A by PCR: NEGATIVE
Influenza B by PCR: NEGATIVE
SARS Coronavirus 2 by RT PCR: NEGATIVE

## 2022-01-30 LAB — LACTIC ACID, PLASMA: Lactic Acid, Venous: 1.2 mmol/L (ref 0.5–1.9)

## 2022-01-30 LAB — MAGNESIUM: Magnesium: 1.8 mg/dL (ref 1.7–2.4)

## 2022-01-30 LAB — PROTIME-INR
INR: 1.1 (ref 0.8–1.2)
Prothrombin Time: 13.7 seconds (ref 11.4–15.2)

## 2022-01-30 LAB — TROPONIN I (HIGH SENSITIVITY)
Troponin I (High Sensitivity): 10 ng/L (ref <18)
Troponin I (High Sensitivity): 10 ng/L (ref <18)

## 2022-01-30 LAB — APTT: aPTT: 27 seconds (ref 24–36)

## 2022-01-30 MED ORDER — ACETAMINOPHEN 500 MG PO TABS
1000.0000 mg | ORAL_TABLET | Freq: Once | ORAL | Status: AC
Start: 2022-01-31 — End: 2022-01-31
  Administered 2022-01-31: 1000 mg via ORAL
  Filled 2022-01-30: qty 2

## 2022-01-30 MED ORDER — IPRATROPIUM-ALBUTEROL 0.5-2.5 (3) MG/3ML IN SOLN
3.0000 mL | Freq: Once | RESPIRATORY_TRACT | Status: AC
  Administered 2022-01-30: 3 mL via RESPIRATORY_TRACT
  Filled 2022-01-30: qty 3

## 2022-01-30 MED ORDER — LACTATED RINGERS IV BOLUS
1000.0000 mL | Freq: Once | INTRAVENOUS | Status: AC
  Administered 2022-01-30: 1000 mL via INTRAVENOUS

## 2022-01-30 MED ORDER — ALBUTEROL SULFATE (2.5 MG/3ML) 0.083% IN NEBU
2.5000 mg | INHALATION_SOLUTION | Freq: Once | RESPIRATORY_TRACT | Status: AC
  Administered 2022-01-30: 2.5 mg via RESPIRATORY_TRACT
  Filled 2022-01-30: qty 3

## 2022-01-30 MED ORDER — SODIUM CHLORIDE 0.9 % IV SOLN
500.0000 mg | Freq: Once | INTRAVENOUS | Status: AC
Start: 2022-01-30 — End: 2022-01-30
  Administered 2022-01-30: 500 mg via INTRAVENOUS
  Filled 2022-01-30: qty 5

## 2022-01-30 MED ORDER — CEFTRIAXONE SODIUM 1 G IJ SOLR
1.0000 g | Freq: Once | INTRAMUSCULAR | Status: AC
Start: 2022-01-30 — End: 2022-01-30
  Administered 2022-01-30: 1 g via INTRAVENOUS
  Filled 2022-01-30: qty 10

## 2022-01-30 MED ORDER — AZITHROMYCIN 250 MG PO TABS: 250.0000 mg | ORAL_TABLET | Freq: Every day | ORAL | 0 refills | Status: AC

## 2022-01-30 MED ORDER — POTASSIUM CHLORIDE CRYS ER 20 MEQ PO TBCR
40.0000 meq | EXTENDED_RELEASE_TABLET | Freq: Once | ORAL | Status: AC
Start: 2022-01-30 — End: 2022-01-30
  Administered 2022-01-30: 40 meq via ORAL
  Filled 2022-01-30: qty 2

## 2022-01-30 MED ORDER — LACTATED RINGERS IV BOLUS
500.0000 mL | Freq: Once | INTRAVENOUS | Status: AC
  Administered 2022-01-30: 500 mL via INTRAVENOUS

## 2022-01-30 MED ORDER — IOHEXOL 350 MG/ML SOLN
100.0000 mL | Freq: Once | INTRAVENOUS | Status: AC | PRN
  Administered 2022-01-30: 100 mL via INTRAVENOUS

## 2022-01-30 MED ORDER — IPRATROPIUM-ALBUTEROL 0.5-2.5 (3) MG/3ML IN SOLN: 3.0000 mL | Freq: Once | RESPIRATORY_TRACT | Status: DC

## 2022-01-30 MED ORDER — ALBUTEROL SULFATE HFA 108 (90 BASE) MCG/ACT IN AERS: 2.0000 | INHALATION_SPRAY | RESPIRATORY_TRACT | Status: DC | PRN

## 2022-01-30 MED ORDER — ALBUTEROL SULFATE (2.5 MG/3ML) 0.083% IN NEBU: 2.5000 mg | INHALATION_SOLUTION | Freq: Once | RESPIRATORY_TRACT | Status: DC

## 2022-01-30 MED ORDER — ALBUTEROL SULFATE HFA 108 (90 BASE) MCG/ACT IN AERS
2.0000 | INHALATION_SPRAY | Freq: Once | RESPIRATORY_TRACT | Status: AC
  Administered 2022-01-31: 2 via RESPIRATORY_TRACT
  Filled 2022-01-30: qty 6.7

## 2022-01-30 MED ORDER — CEFPODOXIME PROXETIL 200 MG PO TABS: 200.0000 mg | ORAL_TABLET | Freq: Two times a day (BID) | ORAL | 0 refills | Status: AC

## 2022-01-30 NOTE — Discharge Instructions (Addendum)
If you develop new or worsening shortness of breath, coughing up blood, high fever, chest pain, palpitations that do not go away, or any other new/concerning symptoms the return to the ER for evaluation.

## 2022-01-30 NOTE — ED Notes (Signed)
XRAy at the Bedside.
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
## 2022-01-30 NOTE — ED Provider Notes (Addendum)
[facility] EMERGENCY DEPT Provider Note   CSN: [id number] Arrival date & time: 01/30/22  1724     History  Chief Complaint  Patient presents with   Cough    Barbara Ayers is a 71 y.o. female.  HPI 71 year old female presents with cough and palpitations. Started about 1 week ago. Has had palpitations the whole time.  Cough has been present the whole time as well but got worse over the last 3 days or so.  Had a fever of 101 about 2 or 3 days ago.  She has been getting weak when she tries to walk and short of breath.  She denies any chronic lung problems but says she gets bronchitis about once a year.  No chest pain during this time.  No leg swelling.  Home Medications Prior to Admission medications   Medication Sig Start Date End Date Taking? Authorizing Provider  azithromycin (ZITHROMAX) 250 MG tablet Take 1 tablet (250 mg total) by mouth daily for 4 days. 01/31/22 02/04/22 Yes Pricilla Loveless, MD  cefpodoxime (VANTIN) 200 MG tablet Take 1 tablet (200 mg total) by mouth 2 (two) times daily for 7 days. 01/31/22 02/07/22 Yes Pricilla Loveless, MD  aspirin EC 81 MG tablet Take 81 mg by mouth daily.    [provider]  atorvastatin (LIPITOR) 40 MG tablet TAKE 1 TABLET BY MOUTH EVERY DAY 05/12/21   Lennette Bihari, MD  BRILINTA 60 MG TABS tablet TAKE 1 TABLET BY MOUTH TWICE A DAY 05/06/21   Lennette Bihari, MD  CALCIUM PO Take 600 mg by mouth daily.     [provider]  Cetirizine HCl 10 MG CAPS Take by mouth.    [provider]  ENBREL SURECLICK 50 MG/ML injection Inject into the skin once a week. 04/09/20   [provider]  folic acid (FOLVITE) 1 MG tablet Take 1 mg by mouth daily.    [provider]  methotrexate (RHEUMATREX) 2.5 MG tablet Take 12.5 mg by mouth once a week. Caution:Chemotherapy. Protect from light.    [provider]  metoprolol tartrate (LOPRESSOR) 25 MG tablet Take 1 tablet (25 mg total) by mouth daily. 08/19/21    Ronney Asters, NP  nitroGLYCERIN (NITROSTAT) 0.4 MG SL tablet Place 0.4 mg under the tongue every 5 (five) minutes as needed for chest pain.    [provider]  Omega-3 Fatty Acids (FISH OIL) 1000 MG CAPS Take 1,200 mg by mouth.     [provider]  tretinoin (RETIN-A) 0.025 % cream Apply topically daily. 08/07/21   [provider]  Turmeric (QC TUMERIC COMPLEX PO) Take 1,000 mg by mouth daily.    [provider]  UNABLE TO FIND supplement 8 greens    [provider]  VITAMIN D PO Take 1,000 Units by mouth.    [provider]      Allergies    Amoxicillin    Review of Systems   Review of Systems  Constitutional:  Positive for fever.  Respiratory:  Positive for cough and shortness of breath.   Cardiovascular:  Positive for palpitations. Negative for chest pain and leg swelling.  Neurological:  Positive for weakness.   Physical Exam Updated Vital Signs BP 115/69    Pulse (!) 127    Temp 98.6 F (37 C) (Oral)    Resp (!) 23    Ht  (1.702 m)    Wt 66.2 kg    LMP 08/10/1998 (  Exact Date)    SpO2 96%    BMI 22.86 kg/m  Physical Exam Vitals and nursing note reviewed.  Constitutional:      General: She is not in acute distress.    Appearance: She is well-developed.  HENT:     Head: Normocephalic and atraumatic.  Cardiovascular:     Rate and Rhythm: Regular rhythm. Tachycardia present.     Heart sounds: Normal heart sounds.  Pulmonary:     Effort: Pulmonary effort is normal. Tachypnea present. No accessory muscle usage or respiratory distress.     Breath sounds: Wheezing and rales present.  Abdominal:     Palpations: Abdomen is soft.     Tenderness: There is no abdominal tenderness.  Musculoskeletal:     Right lower leg: No edema.     Left lower leg: No edema.  Skin:    General: Skin is warm and dry.  Neurological:     Mental Status: She is alert.    ED Results / Procedures / Treatments   Labs (all labs ordered are  listed, but only abnormal results are displayed) Labs Reviewed  COMPREHENSIVE METABOLIC PANEL - Abnormal; Notable for the following components:      Result Value   Potassium 3.3 (*)    Glucose, Bld 108 (*)    All other components within normal limits  RESP PANEL BY RT-PCR (FLU A&B, COVID) ARPGX2  CULTURE, BLOOD (ROUTINE X 2)  CULTURE, BLOOD (ROUTINE X 2)  LACTIC ACID, PLASMA  CBC WITH DIFFERENTIAL/PLATELET  PROTIME-INR  APTT  MAGNESIUM  LACTIC ACID, PLASMA  TROPONIN I (HIGH SENSITIVITY)  TROPONIN I (HIGH SENSITIVITY)    EKG EKG Interpretation  Date/Time:  Friday January 30 2022 19:24:07 EST Ventricular Rate:  97 PR Interval:  132 QRS Duration: 100 QT Interval:  360 QTC Calculation: 458 R Axis:   84 Text Interpretation: Sinus rhythm Probable left atrial enlargement Consider RVH w/ secondary repol abnormality Nonspecific T abnormalities, lateral leads tachycardia from earlier in the day has resolved Confirmed by Pricilla Loveless [phone]) on 01/30/2022 8:18:00 PM  Radiology CT Angio Chest PE W and/or Wo Contrast  Result Date: 01/30/2022 CLINICAL DATA:  Upper respiratory infection, cough and shortness of breath EXAM: CT ANGIOGRAPHY CHEST WITH CONTRAST TECHNIQUE: Multidetector CT imaging of the chest was performed using the standard protocol during bolus administration of intravenous contrast. Multiplanar CT image reconstructions and MIPs were obtained to evaluate the vascular anatomy. RADIATION DOSE REDUCTION: This exam was performed according to the departmental dose-optimization program which includes automated exposure control, adjustment of the mA and/or kV according to patient size and/or use of iterative reconstruction technique. CONTRAST:  OMNIPAQUE IOHEXOL 350 MG/ML SOLN COMPARISON:  None. FINDINGS: Cardiovascular: Satisfactory opacification of the pulmonary arteries to the segmental level. No evidence of pulmonary embolism. Normal heart size. No pericardial effusion.  Mediastinum/Nodes: No enlarged mediastinal, hilar, or axillary lymph nodes. Thyroid gland, trachea, and esophagus demonstrate no significant findings. Lungs/Pleura: Nodular opacities in the right upper lobe (series 5, image 70) and, to a lesser extent, in the left lower lobe (series 5, image 211), which may be infectious or inflammatory. Additional linear basilar opacities, likely atelectasis. Lower lobe predominant bronchial wall thickening and mucous plugging. No pleural effusion or pneumothorax. Upper Abdomen: No acute abnormality. Musculoskeletal: No chest wall abnormality. No acute or significant osseous findings. Review of the MIP images confirms the above findings. IMPRESSION: 1. Negative for pulmonary embolism. 2. Nodular opacities in the right upper lobe and, to a lesser extent,  in the left lower lobe, which are nonspecific but likely infectious or inflammatory. 3. Lower lobe predominant bronchial wall thickening, concerning for bronchitis. Electronically Signed   By: Wiliam KeAlison  Vasan M.D.   On: 01/30/2022 21:30   DG Chest Port 1 View  Result Date: 01/30/2022 CLINICAL DATA:  Upper respiratory infection beginning last Friday. Cough and shortness of breath. EXAM: PORTABLE CHEST 1 VIEW COMPARISON:  None. FINDINGS: Heart size and pulmonary vascularity are normal. Mild hyperinflation may indicate emphysema. Patchy interstitial pattern to the lungs with mostly central predominance. Bronchial wall thickening and mild bronchiectasis suggested. Changes may represent multifocal pneumonia or chronic interstitial process. No pleural effusions. No pneumothorax. Mediastinal contours appear intact. Calcification of the aorta. Degenerative changes in the spine and shoulders. IMPRESSION: Probable emphysematous changes with diffuse interstitial pattern, bronchial thickening, and mild bronchiectasis. Changes may represent early multifocal pneumonia or chronic interstitial process. No focal consolidation. Electronically Signed    By: Burman NievesWilliam  Stevens M.D.   On: 01/30/2022 18:40    Procedures Procedures    Medications Ordered in ED Medications  ipratropium-albuterol (DUONEB) 0.5-2.5 (3) MG/3ML nebulizer solution 3 mL (3 mLs Nebulization Not Given 01/30/22 2143)  albuterol (PROVENTIL) (2.5 MG/3ML) 0.083% nebulizer solution 2.5 mg (2.5 mg Nebulization Not Given 01/30/22 2143)  albuterol (VENTOLIN HFA) 108 (90 Base) MCG/ACT inhaler 2 puff (has no administration in time range)  lactated ringers bolus 1,000 mL (0 mLs Intravenous Stopped 01/30/22 2041)  albuterol (PROVENTIL) (2.5 MG/3ML) 0.083% nebulizer solution 2.5 mg (2.5 mg Nebulization Given 01/30/22 1916)  ipratropium-albuterol (DUONEB) 0.5-2.5 (3) MG/3ML nebulizer solution 3 mL (3 mLs Nebulization Given 01/30/22 1916)  cefTRIAXone (ROCEPHIN) 1 g in sodium chloride 0.9 % 100 mL IVPB (0 g Intravenous Stopped 01/30/22 2045)  azithromycin (ZITHROMAX) 500 mg in sodium chloride 0.9 % 250 mL IVPB (0 mg Intravenous Stopped 01/30/22 2116)  potassium chloride SA (KLOR-CON M) CR tablet 40 mEq (40 mEq Oral Given 01/30/22 2039)  lactated ringers bolus 500 mL (0 mLs Intravenous Stopped 01/30/22 2211)  iohexol (OMNIPAQUE) 350 MG/ML injection 100 mL (100 mLs Intravenous Contrast Given 01/30/22 2113)    ED Course/ Medical Decision Making/ A&P                           Medical Decision Making Amount and/or Complexity of Data Reviewed Labs: ordered. Radiology: ordered.  Risk OTC drugs. Prescription drug management.   Patient is starting to feel better after albuterol and fluids.  Her heart rate has fluctuated greatly, at times being up to 140 and other times being in the 90s.  Seems to be sinus on cardiac monitoring and ECGs.  At 1 point because of the bradycardia itself and how fast it was going in and out I did wonder if she might have an atrial dysrhythmia such as atrial flutter.  I discussed this with cardiology on-call Dr. Brayton LaymanFernandes who has reviewed the ECGs and feels these are all sinus.   It does seem like her heart rate goes up more when she is talking or someone else is talking to her in the room.  I suspect arrhythmia is not the cause.  Chest x-ray seems to show some patchy pneumonia in different places on my view.  However given the tachycardia, CT was obtained to rule out PE.  Fortunately this is negative for PE though does show some probable pneumonia.  She will be given antibiotics, especially given her immunosuppression with RA.  Otherwise, she has no chest  pain.  Her labs are unremarkable besides some mild hypokalemia including normal WBC, normal troponins.  COVID/flu testing is negative.  Her potassium was replaced.  At this point, her heart rate is more normalized on my last exam, around 100.  She feels well enough that she wants to go home.  I think this is reasonable.  We did discuss return precautions but she otherwise is appearing well and feeling better.  Addendum: Paramedic reported that her temp is now 100 oral. Given tylenol. Still feels well and wants to go home.     Final Clinical Impression(s) / ED Diagnoses Final diagnoses:  Community acquired pneumonia, unspecified laterality    Rx / DC Orders ED Discharge Orders          Ordered    cefpodoxime (VANTIN) 200 MG tablet  2 times daily        01/30/22 2310    azithromycin (ZITHROMAX) 250 MG tablet  Daily        01/30/22 2310              Pricilla Loveless, MD 01/30/22 2356    Pricilla Loveless, MD 01/31/22 0000

## 2022-01-31 MED ORDER — AEROCHAMBER PLUS FLO-VU MISC
1.0000 | Freq: Once | Status: AC
  Administered 2022-01-31: 1
  Filled 2022-01-31: qty 1

## 2022-01-31 NOTE — ED Notes (Signed)
RT educated pt on the proper use of MDI w/spacer. Pt able to perform without difficulty. Pt verbalizes understanding of information/administration.

## 2022-02-04 DIAGNOSIS — J189 Pneumonia, unspecified organism: Secondary | ICD-10-CM | POA: Diagnosis not present

## 2022-02-04 LAB — CULTURE, BLOOD (ROUTINE X 2)
Culture: NO GROWTH
Culture: NO GROWTH
Special Requests: ADEQUATE
Special Requests: ADEQUATE

## 2022-02-24 ENCOUNTER — Encounter: Payer: Self-pay | Admitting: Pulmonary Disease

## 2022-02-24 ENCOUNTER — Ambulatory Visit (INDEPENDENT_AMBULATORY_CARE_PROVIDER_SITE_OTHER): Payer: Medicare Other | Admitting: Pulmonary Disease

## 2022-02-24 ENCOUNTER — Other Ambulatory Visit: Payer: Self-pay

## 2022-02-24 VITALS — BP 114/62 | HR 66 | Ht 67.0 in | Wt 149.2 lb

## 2022-02-24 DIAGNOSIS — J4520 Mild intermittent asthma, uncomplicated: Secondary | ICD-10-CM | POA: Diagnosis not present

## 2022-02-24 DIAGNOSIS — J181 Lobar pneumonia, unspecified organism: Secondary | ICD-10-CM | POA: Diagnosis not present

## 2022-02-24 NOTE — Progress Notes (Signed)
Synopsis: Referred in February 2023 for cough, self referral  Subjective:   PATIENT ID: Barbara Ayers GENDER: female DOB: 03/10/1951, MRN: [id number]  HPI  Chief Complaint  Patient presents with   Consult    Self referral for cough. States she will develop a URI every Winter and will develop a cough that will not go away for 3 weeks. Has some occasional DOE.    Barbara Ayers is a 71 year old woman, never smoker with rheumatoid arthritis who is referred to pulmonary clinic for cough.   Patient reports developing cough and palpitations at the end of January and [facility] for worsening symptoms and fever of 101.  Chest x-ray showed multifocal infiltrates and CT chest scan was done which ruled out pulmonary embolism but did show nodular opacities in the right upper lobe and left lower lobe.  Was treated with course of azithromycin and cefpodoxime with improvement of her symptoms.  She also had associated cough with sputum production and wheezing which have improved.  She reports episodes of these respiratory exacerbations 1-2 times per year.  She denies any issues with seasonal allergies.  Cold air, strong perfumes or colognes do not bother her breathing.  No history of asthma in childhood.  She does report intermittent shortness of breath with exertion but this is not with every exertional effort.  Is any chest pain, orthopnea or paroxysmal nocturnal dyspnea.  She has history of rheumatoid arthritis and has been on Enbrel over the past year and has been on methotrexate for 25 years.  She is a never smoker.  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  She does report secondhand smoke in childhood.  Her mother had lung cancer at age 35.  Past Medical History:  Diagnosis Date   Chest pain    Pinched nerve in shoulder, left    RA (rheumatoid arthritis) (HCC)    Transfusion history      Family History  Problem Relation Age of Onset    Heart failure Mother    Heart disease Mother    Lung cancer Mother 17       smoker   Heart disease Father    Breast cancer Maternal Grandmother 19   Diabetes Other    Heart disease Other    Colon cancer Neg Hx    Pancreatic cancer Neg Hx    Stomach cancer Neg Hx    Rectal cancer Neg Hx      Social History   Socioeconomic History   Marital status: Divorced    Spouse name: Not on file   Number of children: Not on file   Years of education: Not on file   Highest education level: Not on file  Occupational History   Not on file  Tobacco Use   Smoking status: Never   Smokeless tobacco: Never  Substance and Sexual Activity   Alcohol use: No   Drug use: No   Sexual activity: Not Currently    Birth control/protection: Post-menopausal  Other Topics Concern   Not on file  Social History Narrative   Not on file   Social Determinants of Health   Financial Resource Strain: Not on file  Food Insecurity: Not on file  Transportation Needs: Not on file  Physical Activity: Not on file  Stress: Not on file  Social Connections: Not on file  Intimate Partner Violence: Not on file     Allergies  Allergen Reactions  Amoxicillin Nausea And Vomiting    States she has had augmentin without issue     Outpatient Medications Prior to Visit  Medication Sig Dispense Refill   aspirin EC 81 MG tablet Take 81 mg by mouth daily.     atorvastatin (LIPITOR) 40 MG tablet TAKE 1 TABLET BY MOUTH EVERY DAY 90 tablet 3   BRILINTA 60 MG TABS tablet TAKE 1 TABLET BY MOUTH TWICE A DAY 180 tablet 3   CALCIUM PO Take 600 mg by mouth daily.      Cetirizine HCl 10 MG CAPS Take by mouth.     ENBREL SURECLICK 50 MG/ML injection Inject into the skin once a week.     folic acid (FOLVITE) 1 MG tablet Take 1 mg by mouth daily.     methotrexate (RHEUMATREX) 2.5 MG tablet Take 12.5 mg by mouth once a week. Caution:Chemotherapy. Protect from light.     metoprolol tartrate (LOPRESSOR) 25 MG tablet Take 1  tablet (25 mg total) by mouth daily. 90 tablet 1   nitroGLYCERIN (NITROSTAT) 0.4 MG SL tablet Place 0.4 mg under the tongue every 5 (five) minutes as needed for chest pain.     Omega-3 Fatty Acids (FISH OIL) 1000 MG CAPS Take 1,200 mg by mouth.      tretinoin (RETIN-A) 0.025 % cream Apply topically daily.     Turmeric (QC TUMERIC COMPLEX PO) Take 1,000 mg by mouth daily.     UNABLE TO FIND supplement 8 greens     VITAMIN D PO Take 1,000 Units by mouth.     No facility-administered medications prior to visit.    Review of Systems  Constitutional:  Negative for chills, fever, malaise/fatigue and weight loss.  HENT:  Negative for congestion, sinus pain and sore throat.   Eyes: Negative.   Respiratory:  Positive for cough and shortness of breath. Negative for hemoptysis, sputum production and wheezing.   Cardiovascular:  Negative for chest pain, palpitations, orthopnea, claudication and leg swelling.  Gastrointestinal:  Negative for abdominal pain, heartburn, nausea and vomiting.  Genitourinary: Negative.   Musculoskeletal:  Positive for joint pain. Negative for myalgias.  Skin:  Negative for rash.  Neurological:  Negative for weakness.  Endo/Heme/Allergies: Negative.   Psychiatric/Behavioral: Negative.       Objective:   Vitals:   02/24/22 1604  BP: 114/62  Pulse: 66  SpO2: 97%  Weight: 149 lb 3.2 oz (67.7 kg)  Height:  (1.702 m)     Physical Exam Constitutional:      General: She is not in acute distress.    Appearance: She is not ill-appearing.  HENT:     Head: Normocephalic and atraumatic.  Eyes:     General: No scleral icterus.    Conjunctiva/sclera: Conjunctivae normal.     Pupils: Pupils are equal, round, and reactive to light.  Cardiovascular:     Rate and Rhythm: Normal rate and regular rhythm.     Pulses: Normal pulses.     Heart sounds: Normal heart sounds. No murmur heard. Pulmonary:     Effort: Pulmonary effort is normal.     Breath sounds: Normal  breath sounds. No wheezing, rhonchi or rales.  Abdominal:     General: Bowel sounds are normal.     Palpations: Abdomen is soft.  Musculoskeletal:     Right lower leg: No edema.     Left lower leg: No edema.  Lymphadenopathy:     Cervical: No cervical adenopathy.  Skin:  General: Skin is warm and dry.  Neurological:     General: No focal deficit present.     Mental Status: She is alert.  Psychiatric:        Mood and Affect: Mood normal.        Behavior: Behavior normal.        Thought Content: Thought content normal.        Judgment: Judgment normal.      CBC    Component Value Date/Time   WBC 7.1 01/30/2022 1908   RBC 3.90 01/30/2022 1908   HGB 13.0 01/30/2022 1908   HGB 13.5 04/11/2021 1041   HGB 14.2 10/14/2016 0924   HCT 38.3 01/30/2022 1908   HCT 39.7 04/11/2021 1041   PLT 163 01/30/2022 1908   PLT 247 04/11/2021 1041   MCV 98.2 01/30/2022 1908   MCV 99 (H) 04/11/2021 1041   MCH 33.3 01/30/2022 1908   MCHC 33.9 01/30/2022 1908   RDW 13.6 01/30/2022 1908   RDW 13.1 04/11/2021 1041   LYMPHSABS 1.0 01/30/2022 1908   MONOABS 0.6 01/30/2022 1908   EOSABS 0.0 01/30/2022 1908   BASOSABS 0.0 01/30/2022 1908   BMP Latest Ref Rng & Units 01/30/2022 04/11/2021 09/16/2017  Glucose 70 - 99 mg/dL 657(Q108(H) 85 81  BUN 8 - 23 mg/dL 19 24 18   Creatinine 0.44 - 1.00 mg/dL 4.690.82 6.290.82 5.280.82  BUN/Creat Ratio 12 - 28 - 29(H) 22  Sodium 135 - 145 mmol/L 138 144 140  Potassium 3.5 - 5.1 mmol/L 3.3(L) 4.3 4.3  Chloride 98 - 111 mmol/L 102 105 101  CO2 22 - 32 mmol/L 25 25 24   Calcium 8.9 - 10.3 mg/dL 8.9 9.2 9.2   Chest imaging: CTA Chest 01/30/22 1. Negative for pulmonary embolism. 2. Nodular opacities in the right upper lobe and, to a lesser extent, in the left lower lobe, which are nonspecific but likely infectious or inflammatory. 3. Lower lobe predominant bronchial wall thickening, concerning for bronchitis.  PFT: No flowsheet data found.  Labs:  Path:  Echo:  Heart  Catheterization:       Assessment & Plan:   Mild intermittent reactive airway disease without complication - Plan: Pulmonary Function Test  Lobar pneumonia, unspecified organism (HCC) - Plan: CT Chest Wo Contrast  Discussion: Kelle DartingJanet Nyman is a 71 year old woman, never smoker with rheumatoid arthritis who is referred to pulmonary clinic for cough.   She appears to have community-acquired pneumonia which was appropriately treated earlier this month.  Her symptoms of cough, wheezing and sputum production have resolved.  She continues to experience intermittent exertional dyspnea.  She may have a degree of reactive airways disease.  We discussed potential treatment for as needed inhaler therapy which she wishes to hold off on at this time.  We will follow-up a CT chest scan at the end of May or early June to monitor for resolution of the nodular opacities on her recent CT scan.  We will also obtain pulmonary function tests at follow-up.  Follow-up in 3 months.  Melody ComasJonathan Sanah Kraska, MD [facility] Office: [phone]   Current Outpatient Medications:    aspirin EC 81 MG tablet, Take 81 mg by mouth daily., Disp: , Rfl:    atorvastatin (LIPITOR) 40 MG tablet, TAKE 1 TABLET BY MOUTH EVERY DAY, Disp: 90 tablet, Rfl: 3   BRILINTA 60 MG TABS tablet, TAKE 1 TABLET BY MOUTH TWICE A DAY, Disp: 180 tablet, Rfl: 3   CALCIUM PO, Take 600  mg by mouth daily. , Disp: , Rfl:    Cetirizine HCl 10 MG CAPS, Take by mouth., Disp: , Rfl:    ENBREL SURECLICK 50 MG/ML injection, Inject into the skin once a week., Disp: , Rfl:    folic acid (FOLVITE) 1 MG tablet, Take 1 mg by mouth daily., Disp: , Rfl:    methotrexate (RHEUMATREX) 2.5 MG tablet, Take 12.5 mg by mouth once a week. Caution:Chemotherapy. Protect from light., Disp: , Rfl:    metoprolol tartrate (LOPRESSOR) 25 MG tablet, Take 1 tablet (25 mg total) by mouth daily., Disp: 90 tablet, Rfl: 1   nitroGLYCERIN (NITROSTAT) 0.4 MG SL  tablet, Place 0.4 mg under the tongue every 5 (five) minutes as needed for chest pain., Disp: , Rfl:    Omega-3 Fatty Acids (FISH OIL) 1000 MG CAPS, Take 1,200 mg by mouth. , Disp: , Rfl:    tretinoin (RETIN-A) 0.025 % cream, Apply topically daily., Disp: , Rfl:    Turmeric (QC TUMERIC COMPLEX PO), Take 1,000 mg by mouth daily., Disp: , Rfl:    UNABLE TO FIND, supplement 8 greens, Disp: , Rfl:    VITAMIN D PO, Take 1,000 Units by mouth., Disp: , Rfl:

## 2022-02-24 NOTE — Patient Instructions (Addendum)
We will check a CT Chest scan in late May or Early June to follow up on the recent scan with evidence of pneumonia  We will check pulmonary function tests at your follow up visit  Follow up end of May or early June for pulmonary function tests  Please call us if your symptoms return

## 2022-02-25 ENCOUNTER — Encounter: Payer: Self-pay | Admitting: Pulmonary Disease

## 2022-03-16 DIAGNOSIS — R059 Cough, unspecified: Secondary | ICD-10-CM | POA: Diagnosis not present

## 2022-03-16 DIAGNOSIS — M0600 Rheumatoid arthritis without rheumatoid factor, unspecified site: Secondary | ICD-10-CM | POA: Diagnosis not present

## 2022-03-16 DIAGNOSIS — M1990 Unspecified osteoarthritis, unspecified site: Secondary | ICD-10-CM | POA: Diagnosis not present

## 2022-03-16 DIAGNOSIS — Z79899 Other long term (current) drug therapy: Secondary | ICD-10-CM | POA: Diagnosis not present

## 2022-03-16 DIAGNOSIS — I2510 Atherosclerotic heart disease of native coronary artery without angina pectoris: Secondary | ICD-10-CM | POA: Diagnosis not present

## 2022-03-16 DIAGNOSIS — M7070 Other bursitis of hip, unspecified hip: Secondary | ICD-10-CM | POA: Diagnosis not present

## 2022-03-16 DIAGNOSIS — M8580 Other specified disorders of bone density and structure, unspecified site: Secondary | ICD-10-CM | POA: Diagnosis not present

## 2022-03-21 ENCOUNTER — Emergency Department (HOSPITAL_BASED_OUTPATIENT_CLINIC_OR_DEPARTMENT_OTHER)
Admission: EM | Admit: 2022-03-21 | Discharge: 2022-03-21 | Disposition: A | Discharge status: Home / Self Care | Payer: Medicare Other | Attending: Emergency Medicine | Admitting: Emergency Medicine

## 2022-03-21 ENCOUNTER — Other Ambulatory Visit: Payer: Self-pay

## 2022-03-21 ENCOUNTER — Encounter (HOSPITAL_BASED_OUTPATIENT_CLINIC_OR_DEPARTMENT_OTHER): Payer: Self-pay

## 2022-03-21 ENCOUNTER — Emergency Department (HOSPITAL_BASED_OUTPATIENT_CLINIC_OR_DEPARTMENT_OTHER): Payer: Medicare Other

## 2022-03-21 DIAGNOSIS — Z7982 Long term (current) use of aspirin: Secondary | ICD-10-CM | POA: Insufficient documentation

## 2022-03-21 DIAGNOSIS — Y92009 Unspecified place in unspecified non-institutional (private) residence as the place of occurrence of the external cause: Secondary | ICD-10-CM | POA: Insufficient documentation

## 2022-03-21 DIAGNOSIS — Z79899 Other long term (current) drug therapy: Secondary | ICD-10-CM | POA: Diagnosis not present

## 2022-03-21 DIAGNOSIS — J189 Pneumonia, unspecified organism: Secondary | ICD-10-CM | POA: Diagnosis not present

## 2022-03-21 DIAGNOSIS — S098XXA Other specified injuries of head, initial encounter: Secondary | ICD-10-CM

## 2022-03-21 DIAGNOSIS — W108XXA Fall (on) (from) other stairs and steps, initial encounter: Secondary | ICD-10-CM | POA: Insufficient documentation

## 2022-03-21 DIAGNOSIS — S060X0A Concussion without loss of consciousness, initial encounter: Secondary | ICD-10-CM

## 2022-03-21 DIAGNOSIS — R220 Localized swelling, mass and lump, head: Secondary | ICD-10-CM | POA: Diagnosis not present

## 2022-03-21 DIAGNOSIS — T148XXA Other injury of unspecified body region, initial encounter: Secondary | ICD-10-CM

## 2022-03-21 DIAGNOSIS — S0990XA Unspecified injury of head, initial encounter: Secondary | ICD-10-CM | POA: Diagnosis not present

## 2022-03-21 DIAGNOSIS — S0512XA Contusion of eyeball and orbital tissues, left eye, initial encounter: Secondary | ICD-10-CM | POA: Diagnosis not present

## 2022-03-21 NOTE — ED Triage Notes (Signed)
She states she tripped (didn't pass out) and fell down a few stairs at home today. She c/o swelling and ecchymosis at left lat. Eyebrow area, plus an area of discomfort at lat. Left lower leg. She is ambulatory and in no distress. She is alert and oriented x 4 with clear speech. ?

## 2022-03-21 NOTE — ED Provider Notes (Signed)
?[facility] ?Provider Note ? ? ?CSN: [id number] ?Arrival date & time: 03/21/22  1701 ? ?  ? ?History ? ?Chief Complaint  ?Patient presents with  ? Fall  ? ? ?Barbara Ayers is a 71 y.o. female. ? ?Pt is a 71 yo female presenting for fall. Pt admits to mechanical fall, falling forward, hitting her forward against the sidewalk, occurred directly prior to arrival. Patient denies neck pain. Admits to headache and swelling on forehead. Denies sensation or motor deficits. Denies vision changes. On Brillinta and daily ASA..  ? ?The history is provided by the patient. No language interpreter was used.  ?Fall ?Pertinent negatives include no chest pain, no abdominal pain and no shortness of breath.  ? ?  ? ?Home Medications ?Prior to Admission medications   ?Medication Sig Start Date End Date Taking? Authorizing Provider  ?aspirin EC 81 MG tablet Take 81 mg by mouth daily.    [provider]  ?atorvastatin (LIPITOR) 40 MG tablet TAKE 1 TABLET BY MOUTH EVERY DAY 05/12/21   Troy Sine, MD  ?BRILINTA 60 MG TABS tablet TAKE 1 TABLET BY MOUTH TWICE A DAY 05/06/21   Troy Sine, MD  ?CALCIUM PO Take 600 mg by mouth daily.     [provider]  ?Cetirizine HCl 10 MG CAPS Take by mouth.    [provider]  ?ENBREL SURECLICK 50 MG/ML injection Inject into the skin once a week. 04/09/20   [provider]  ?folic acid (FOLVITE) 1 MG tablet Take 1 mg by mouth daily.    [provider]  ?methotrexate (RHEUMATREX) 2.5 MG tablet Take 12.5 mg by mouth once a week. Caution:Chemotherapy. Protect from light.    [provider]  ?metoprolol tartrate (LOPRESSOR) 25 MG tablet Take 1 tablet (25 mg total) by mouth daily. 08/19/21   Deberah Pelton, NP  ?nitroGLYCERIN (NITROSTAT) 0.4 MG SL tablet Place 0.4 mg under the tongue every 5 (five) minutes as needed for chest pain.    [provider]  ?Omega-3 Fatty Acids (FISH OIL) 1000 MG CAPS Take 1,200 mg by  mouth.     [provider]  ?tretinoin (RETIN-A) 0.025 % cream Apply topically daily. 08/07/21   [provider]  ?Turmeric (QC TUMERIC COMPLEX PO) Take 1,000 mg by mouth daily.    [provider]  ?UNABLE TO FIND supplement 8 greens    [provider]  ?VITAMIN D PO Take 1,000 Units by mouth.    [provider]  ?   ? ?Allergies    ?Amoxicillin   ? ?Review of Systems   ?Review of Systems  ?Constitutional:  Negative for chills and fever.  ?HENT:  Negative for ear pain and sore throat.   ?Eyes:  Negative for pain and visual disturbance.  ?Respiratory:  Negative for cough and shortness of breath.   ?Cardiovascular:  Negative for chest pain and palpitations.  ?Gastrointestinal:  Negative for abdominal pain and vomiting.  ?Genitourinary:  Negative for dysuria and hematuria.  ?Musculoskeletal:  Negative for arthralgias and back pain.  ?Skin:  Positive for wound. Negative for color change and rash.  ?Neurological:  Negative for seizures and syncope.  ?All other systems reviewed and are negative. ? ?Physical Exam ?Updated Vital Signs ?BP (!) 145/85 (BP Location: Right Arm)   Pulse 69   Temp 99.1 ?F (37.3 ?C)   Resp 12   LMP 08/10/1998 (Exact Date)   SpO2 96%  ?Physical Exam ?Vitals and nursing  note reviewed.  ?Constitutional:   ?   General: She is not in acute distress. ?   Appearance: She is well-developed.  ?HENT:  ?   Head:  ? ?Eyes:  ?   General: Lids are normal. Vision grossly intact.  ?   Conjunctiva/sclera: Conjunctivae normal.  ?   Pupils: Pupils are equal, round, and reactive to light.  ?   Visual Fields: Right eye visual fields normal and left eye visual fields normal.  ?Cardiovascular:  ?   Rate and Rhythm: Normal rate and regular rhythm.  ?   Heart sounds: No murmur heard. ?Pulmonary:  ?   Effort: Pulmonary effort is normal. No respiratory distress.  ?   Breath sounds: Normal breath sounds.  ?Abdominal:  ?   Palpations: Abdomen is soft.  ?   Tenderness: There is  no abdominal tenderness.  ?Musculoskeletal:     ?   General: No swelling.  ?   Cervical back: Neck supple. No bony tenderness.  ?   Thoracic back: No bony tenderness.  ?   Lumbar back: No bony tenderness.  ?Skin: ?   General: Skin is warm and dry.  ?   Capillary Refill: Capillary refill takes less than 2 seconds.  ?Neurological:  ?   Mental Status: She is alert and oriented to person, place, and time.  ?   GCS: GCS eye subscore is 4. GCS verbal subscore is 5. GCS motor subscore is 6.  ?   Cranial Nerves: Cranial nerves 2-12 are intact.  ?   Sensory: Sensation is intact.  ?   Motor: Motor function is intact.  ?   Coordination: Coordination is intact.  ?   Gait: Gait is intact.  ?Psychiatric:     ?   Mood and Affect: Mood normal.  ? ? ?ED Results / Procedures / Treatments   ?Labs ?(all labs ordered are listed, but only abnormal results are displayed) ?Labs Reviewed - No data to display ? ?EKG ?None ? ?Radiology ?No results found. ? ?Procedures ?Procedures  ? ? ?Medications Ordered in ED ?Medications - No data to display ? ?ED Course/ Medical Decision Making/ A&P ?  ?                        ?Medical Decision Making ?Amount and/or Complexity of Data Reviewed ?Radiology: ordered. ? ? ?5:52 PM ?71 yo female presenting for fall with facial trauma. Pt is Aox3, no acute distress, afebrile, with stable vitals.  Patient has left supraorbital hematoma.  No neurological deficits.  CT head and face demonstrates hematoma only. ? ?Patient recommended to rest, ice, and Tylenol. Patient in no distress and overall condition improved here in the ED. Detailed discussions were had with the patient regarding current findings, and need for close f/u with PCP or on call doctor. The patient has been instructed to return immediately if the symptoms worsen in any way for re-evaluation. Patient verbalized understanding and is in agreement with current care plan. All questions answered prior to discharge. ? ? ? ? ? ? ? ? ?Final Clinical  Impression(s) / ED Diagnoses ?Final diagnoses:  ?Hematoma  ?Blunt head trauma, initial encounter  ?Concussion without loss of consciousness, initial encounter  ? ? ?Rx / DC Orders ?ED Discharge Orders   ? ? None  ? ?  ? ? ?  ?Lianne Cure, DO ?[id number] ? ?

## 2022-03-21 NOTE — Discharge Instructions (Signed)
Rest, apply ice, Tylenol for discomfort. ?

## 2022-03-21 NOTE — ED Notes (Signed)
EMT-P provided AVS using Teachback Method. Patient verbalizes understanding of Discharge Instructions. Opportunity for Questioning and Answers were provided by EMT-P. Patient Discharged from ED.  ? ?

## 2022-03-23 ENCOUNTER — Encounter: Payer: Self-pay | Admitting: Cardiovascular Disease

## 2022-03-23 ENCOUNTER — Other Ambulatory Visit: Payer: Self-pay

## 2022-03-23 ENCOUNTER — Ambulatory Visit (INDEPENDENT_AMBULATORY_CARE_PROVIDER_SITE_OTHER): Payer: Medicare Other | Admitting: Cardiovascular Disease

## 2022-03-23 VITALS — BP 110/70 | HR 62 | Ht 67.0 in | Wt 150.0 lb

## 2022-03-23 DIAGNOSIS — E785 Hyperlipidemia, unspecified: Secondary | ICD-10-CM | POA: Diagnosis not present

## 2022-03-23 DIAGNOSIS — I2510 Atherosclerotic heart disease of native coronary artery without angina pectoris: Secondary | ICD-10-CM

## 2022-03-23 DIAGNOSIS — R002 Palpitations: Secondary | ICD-10-CM

## 2022-03-23 MED ORDER — METOPROLOL SUCCINATE ER 25 MG PO TB24: 25.0000 mg | ORAL_TABLET | Freq: Every day | ORAL | 1 refills | Status: DC

## 2022-03-23 NOTE — Patient Instructions (Signed)
Medication Instructions:  ?STOP the Metoprolol Tartrate ? ?START Metoprolol Succinate 25 mg once daily ? ?*If you need a refill on your cardiac medications before your next appointment, please call your pharmacy* ? ? ?Lab Work: ?Your provider would like for you to return this week to have the following labs drawn: Fasting Lipid, LPa, CMET and TSH You do not need an appointment for the lab. Once in our office lobby there is a podium where you can sign in and ring the doorbell to alert us that you are here. The lab is open from 8:00 am to 4 pm; closed for lunch from 12:45pm-1:45pm. ? ?You may also go to any of these [facility] locations: ?  ?[place] ?- [address] ([facility]) ?- [address] ?- [address] ?  ? ?- [address]  ?  ?[place]  ?- [address]  ?  ?[place] ?- [address] ?- [id number] ?  ?[place]  ?- [address] ?- [address] ([facility] ? ?If you have labs (blood work) drawn today and your tests are completely normal, you will receive your results only by: ?MyChart Message (if you have MyChart) OR ?A paper copy in the mail ?If you have any lab test that is abnormal or we need to change your treatment, we will call you to review the results. ? ? ?Testing/Procedures: ?None ordered ? ? ? ?Follow-Up: ?At [facility], you and your health needs are our priority.  As part of our continuing mission to provide you with exceptional heart care, we have created designated Provider Care Teams.  These Care Teams include your primary Cardiologist (physician) and Advanced Practice Providers (APPs -  Physician Assistants and Nurse Practitioners) who all work together to provide you with the care you need, when you need it. ? ?We recommend signing up for the patient portal called "MyChart".  Sign up information is provided on this After Visit Summary.  MyChart is  used to connect with patients for Virtual Visits (Telemedicine).  Patients are able to view lab/test results, encounter notes, upcoming appointments, etc.  Non-urgent messages can be sent to your provider as well.   ?To learn more about what you can do with MyChart, go to [email or web address].   ? ?Your next appointment:   ?Follow up in August with Dr. Tresa EndoKelly ?

## 2022-03-23 NOTE — Progress Notes (Signed)
? ?Cardiology Office Note   ? ?Date:  03/23/2022  ? ?ID:  Barbara ChadJanet P Erkkila, DOB 1951-12-05, MRN [id number] ? ?PCP:  Shon Haleimberlake, Kathryn S, MD  ?Cardiologist:  Nicki Guadalajarahomas Kenniyah Sasaki, MD  ? ?F/U cardiology evaluation: Initially referred by Dr. Kem Boroughsori Bradsher at [facility] ? ?History of Present Illness:  ?Barbara Ayers is a 71 y.o. female who I saw on September 14, 2017 through the referral of  Dr. Kem Boroughsori Bradsher to establish cardiology care.  I last saw her in July 2022. ? ?Barbara Ayers is an active female who has a history of rheumatoid arthritis for over 20 years.  She has routinely exercised.  In 2018 she began to notice exertional symptoms of vague chest tightness when running.  She is a personal friend of Dr. Kem Boroughsori Bradsher and was evaluated by her in [place].  She underwent an echo Doppler study which showed an EF of 55% with normal diastolic function.  There was trace to mild aortic insufficiency, mild MR and TR.  She was referred for a nuclear stress test which revealed equivocal findings for anteroseptal ischemia, although she did experience chest pain and had ischemic ECG abnormalities and probable rate related bundle branch block and a brief episode of nonsustained VT.  Her chest pain required approximately 55 minutes for complete resolution in recovery.  She  was referred to Dr. Louisa SecondMichael Komada at [facility] in cardiac catheterization on 04/15/2017 showed a 99% ostial stenosis of her LAD which was successfully stented with a 3.0?12 mm Resolute onyx DES stent postdilated to 3.25 mm.  She was also found to have mild 20% ostial left main narrowing, 40% mid LAD stenosis and 20% mid RCA stenosis.  Following her intervention, she has been pain-free.  She is back exercising and denies any exertional symptomatology.  She has seen Dr. Domingo SepBradsher in follow-up.  Since the [place] in [place] she was referred by Dr. Ermalinda MemosBradshaw to me to continue her cardiology care. ? ?When I saw her for  initial evaluation in September 2018 she was doing well.  She specifically denies recurrent anginal symptoms, palpitations, presyncope or syncope.  She is followed by Dr. Kevan NyGates for primary care.   ? ?When seen in October 2019 she remained stable and continued to be active.  She was doing yoga several times per week and was not been running as much as she had in the past.  Recently she has started to notice a vague left upper chest sensation which seems to be exacerbated when she lifts her arms above her head suggesting a possible musculoskeletal etiology.  However this is new.  She is unaware of any definitive exertional precipitation.  She has continued to be on aspirin plus Brilinta 90 mg twice a day and denies bleeding.  She continues to be on methotrexate weekly.  She is on Prozac 10 mg daily.   ? ?She underwent a nuclear stress test on November 01, 2018.  She was found to have a very small defect of mild severity in the anterior to apical anterior segment felt  consistent with breast attenuation.  There was no ischemia.  Post stress ejection fraction was 58%.  There were no diagnostic ST segment changes of ischemia.   ? ?She was seen in follow-up in December 2019 at which time her chest pain had resolved.  I reviewed her nuclear images with her in detail.   ? ?She was seen in August 2020 by [place], [place] and continue to do  well.  She was having occasional back discomfort attributed to muscle spasm. ? ?I saw her in May 2021 and over the prior year she had continued to do well from a cardiac standpoint.  She is followed by Dr. Kathi Ludwig for her rheumatoid arthritis.  She denied any recurrent anginal symptoms or shortness of breath.  She was concerned about an abdominal aortic aneurysm since 3 family members had developed this.  She continued to be on atorvastatin 40 mg for hyperlipidemia with target LDL less than 70.  She was on Enbrel for rheumatoid arthritis in addition to methotrexate.  She continues to be on aspirin  and low-dose Brilinta at 60 mg twice a day.   ? ?She underwent an abdominal aortic ultrasound on May 13, 2020 which was normal and did not show any evidence for abdominal aortic aneurysm.  Was ectasia of a right common iliac artery and left common iliac artery.  Heart is aortic measurement was 2.4 cm. ? ?I saw her in April 2022 she had been doing fairly well until several weeks prior to that evaluation when she noticed intermittent tachypalpitations over 4-day.period which would have an abrupt onset and abrupt discontinuance.  On one occasion she felt her heart rate increased to 131 bpm.  Several of these episodes lasted several hours for cessation and they all subsided spontaneously.  She denied any recent caffeine use.  She was sleeping well.  She continued to jog at a very slow pace typically at a pace of 1 mile in 15 minutes.  He denied chest tightness or pressure.  During that evaluation I recommended that she wear a 2-week Zio patch monitor, recommended laboratory, and a 2D echo Doppler study. ? ?She wore a Zio patch monitor from April 21 through May 01, 2021.  The predominant rhythm was sinus rhythm with an average heart rate at 71 bpm.  The slowest heart rate was sinus bradycardia at 44 bpm which occurred at 6 AM while she was still sleeping.  The maximum heart rate was 171.  She had 118 short bursts of SVT with the run with the fastest interval lasting 4 beats with a maximum rate of 171.  The longest episode lasted 11.6 seconds with an average rate of 136.  Some of the episodes of SVT may possibly have been atrial tachycardia with variable block.  There appeared to be an episode of atrial flutter which lasted for 5 minutes and 28 seconds at an average rate at 139.  There were rare isolated PACs, atrial couplets there were rare PAC, PVCs.  Laboratory showed normal chemistry with creatinine 0.82.  Potassium 4.3.  Hemoglobin hematocrit were stable at 13.5 and 39.7.  Magnesium was 2.4.  Lipid studies were  excellent with total cholesterol 138, triglycerides 52, HDL 67, and LDL 59.  A 2D echo Doppler study was done on May 30, 2021 which showed an EF of 60 to 65% with mild concentric LVH.  There was mild mitral regurgitation.  There was borderline dilatation of the aortic root. ? ?I last saw her on July 03, 2021 at which time she felt well and was unaware of any  tachypalpitations except on one occasion when she did take metoprolol titrate 25 mg with prompt relief.  She continued to be fairly active working in the yard.  She does admit to some fatigue.  She believes she is sleeping well.  She denied any chest pain or exertional dyspnea. ? ?Since I last saw her, she was evaluated by Verdon Cummins  Cleaver on August 05, 2021 and due to some interscapular tightness, left shoulder discomfort a subsequent nuclear stress test was recommended which was performed on August 12, 2021 and remained low risk with EF 53% and no ST-T abnormalities.  She saw him for follow-up on August 19, 2021 and remained stable. ? ?She underwent a follow-up echo Doppler study on October 31, 2021 which showed an EF of 64% with grade 1 diastolic dysfunction.  She had normal strain.  She had normal wall motion.  There was mild aortic sclerosis. ? ?She had undergone an ER evaluation with cough, fever and palpitations.  A CT ruled out pulmonary embolism and showed nodular opacities in the right upper lobe and left lower lobe.  She was treated with antibiotic therapy.  She was subsequently evaluated by Dr. Johann Capers of pulmonology on February 24, 2022.  She was felt that she had a "community-acquired pneumonia which was appropriately treated.  She has a history of rheumatoid arthritis and has been on Enbrel in addition to methotrexate for years.  It was recommended she undergo a follow-up chest CT and subsequent pulmonary function studies in the future. ? ?Presently, she comes to the office today stating that she experiences some shortness of breath when  walking upstairs.  She denies any chest pressure.  She admits to some tightness of her trapezius muscle region.  Her palpitations did improve with beta-blocker therapy.  Presently she is on atorvastatin 40 mg fo

## 2022-03-29 ENCOUNTER — Encounter: Payer: Self-pay | Admitting: Cardiovascular Disease

## 2022-03-31 DIAGNOSIS — I2510 Atherosclerotic heart disease of native coronary artery without angina pectoris: Secondary | ICD-10-CM | POA: Diagnosis not present

## 2022-03-31 DIAGNOSIS — R002 Palpitations: Secondary | ICD-10-CM | POA: Diagnosis not present

## 2022-03-31 DIAGNOSIS — E785 Hyperlipidemia, unspecified: Secondary | ICD-10-CM | POA: Diagnosis not present

## 2022-04-01 LAB — COMPREHENSIVE METABOLIC PANEL
ALT: 22 IU/L (ref 0–32)
AST: 27 IU/L (ref 0–40)
Albumin/Globulin Ratio: 2.1 (ref 1.2–2.2)
Albumin: 4.6 g/dL (ref 3.8–4.8)
Alkaline Phosphatase: 75 IU/L (ref 44–121)
BUN/Creatinine Ratio: 22 (ref 12–28)
BUN: 18 mg/dL (ref 8–27)
Bilirubin Total: 0.9 mg/dL (ref 0.0–1.2)
CO2: 23 mmol/L (ref 20–29)
Calcium: 9.4 mg/dL (ref 8.7–10.3)
Chloride: 105 mmol/L (ref 96–106)
Creatinine, Ser: 0.83 mg/dL (ref 0.57–1.00)
Globulin, Total: 2.2 g/dL (ref 1.5–4.5)
Glucose: 88 mg/dL (ref 70–99)
Potassium: 4.3 mmol/L (ref 3.5–5.2)
Sodium: 141 mmol/L (ref 134–144)
Total Protein: 6.8 g/dL (ref 6.0–8.5)
eGFR: 76 mL/min/{1.73_m2} (ref >59)

## 2022-04-01 LAB — LIPID PANEL
Chol/HDL Ratio: 2 ratio (ref 0.0–4.4)
Cholesterol, Total: 128 mg/dL (ref 100–199)
HDL: 63 mg/dL (ref >39)
LDL Chol Calc (NIH): 52 mg/dL (ref 0–99)
Triglycerides: 64 mg/dL (ref 0–149)
VLDL Cholesterol Cal: 13 mg/dL (ref 5–40)

## 2022-04-01 LAB — TSH: TSH: 5.15 u[IU]/mL — ABNORMAL HIGH (ref 0.450–4.500)

## 2022-04-01 LAB — LIPOPROTEIN A (LPA): Lipoprotein (a): 29.8 nmol/L (ref <75.0)

## 2022-04-09 ENCOUNTER — Encounter: Payer: Self-pay | Admitting: Cardiovascular Disease

## 2022-04-28 ENCOUNTER — Other Ambulatory Visit: Payer: Self-pay | Admitting: Cardiovascular Disease

## 2022-05-14 DIAGNOSIS — Z23 Encounter for immunization: Secondary | ICD-10-CM | POA: Diagnosis not present

## 2022-05-27 ENCOUNTER — Encounter (HOSPITAL_BASED_OUTPATIENT_CLINIC_OR_DEPARTMENT_OTHER): Payer: Self-pay

## 2022-05-27 ENCOUNTER — Ambulatory Visit (HOSPITAL_BASED_OUTPATIENT_CLINIC_OR_DEPARTMENT_OTHER)
Admission: RE | Admit: 2022-05-27 | Discharge: 2022-05-27 | Disposition: A | Discharge status: Home / Self Care | Payer: Medicare Other | Source: Ambulatory Visit | Attending: Internal Medicine | Admitting: Internal Medicine

## 2022-05-27 DIAGNOSIS — J189 Pneumonia, unspecified organism: Secondary | ICD-10-CM | POA: Diagnosis not present

## 2022-05-27 DIAGNOSIS — J181 Lobar pneumonia, unspecified organism: Secondary | ICD-10-CM | POA: Insufficient documentation

## 2022-06-01 ENCOUNTER — Ambulatory Visit (INDEPENDENT_AMBULATORY_CARE_PROVIDER_SITE_OTHER): Payer: Medicare Other | Admitting: Pulmonary Disease

## 2022-06-01 ENCOUNTER — Encounter: Payer: Self-pay | Admitting: Pulmonary Disease

## 2022-06-01 VITALS — BP 124/72 | HR 63 | Ht 66.25 in | Wt 148.0 lb

## 2022-06-01 DIAGNOSIS — J181 Lobar pneumonia, unspecified organism: Secondary | ICD-10-CM

## 2022-06-01 DIAGNOSIS — R0602 Shortness of breath: Secondary | ICD-10-CM | POA: Diagnosis not present

## 2022-06-01 DIAGNOSIS — J4520 Mild intermittent asthma, uncomplicated: Secondary | ICD-10-CM

## 2022-06-01 LAB — PULMONARY FUNCTION TEST
DL/VA % pred: 113 %
DL/VA: 4.59 ml/min/mmHg/L
DLCO cor % pred: 94 %
DLCO cor: 19.77 ml/min/mmHg
DLCO unc % pred: 94 %
DLCO unc: 19.77 ml/min/mmHg
FEF 25-75 Post: 2.28 L/sec
FEF 25-75 Pre: 1.6 L/sec
FEF2575-%Change-Post: 42 %
FEF2575-%Pred-Post: 114 %
FEF2575-%Pred-Pre: 80 %
FEV1-%Change-Post: 7 %
FEV1-%Pred-Post: 91 %
FEV1-%Pred-Pre: 84 %
FEV1-Post: 2.24 L
FEV1-Pre: 2.08 L
FEV1FVC-%Change-Post: 7 %
FEV1FVC-%Pred-Pre: 100 %
FEV6-%Change-Post: 0 %
FEV6-%Pred-Post: 88 %
FEV6-%Pred-Pre: 88 %
FEV6-Post: 2.73 L
FEV6-Pre: 2.73 L
FEV6FVC-%Change-Post: 0 %
FEV6FVC-%Pred-Post: 104 %
FEV6FVC-%Pred-Pre: 104 %
FVC-%Change-Post: 0 %
FVC-%Pred-Post: 84 %
FVC-%Pred-Pre: 84 %
FVC-Post: 2.73 L
FVC-Pre: 2.74 L
Post FEV1/FVC ratio: 82 %
Post FEV6/FVC ratio: 100 %
Pre FEV1/FVC ratio: 76 %
Pre FEV6/FVC Ratio: 100 %
RV % pred: 121 %
RV: 2.82 L
TLC % pred: 103 %
TLC: 5.56 L

## 2022-06-01 NOTE — Progress Notes (Signed)
PFT done today. 

## 2022-06-01 NOTE — Progress Notes (Signed)
Synopsis: Referred in February 2023 for cough, self referral  Subjective:   PATIENT ID: Barbara Ayers GENDER: female DOB: 12-Jul-1951, MRN: [id number]  HPI  Chief Complaint  Patient presents with   Follow-up    F/U after PFT and CT scan. States her SOB has improved since last visit.   Barbara Ayers is a 71 year old woman, never smoker with rheumatoid arthritis who returns to pulmonary clinic for cough.   Her cough has resolved but she continues to have exertional dyspnea when using the stairs for example.   HRCT Chest shows no evidence of ILD.  PFTs are within normal limts.   Initial OV 02/24/22 Patient reports developing cough and palpitations at the end of January and presented to [facility] for worsening symptoms and fever of 101.  Chest x-ray showed multifocal infiltrates and CT chest scan was done which ruled out pulmonary embolism but did show nodular opacities in the right upper lobe and left lower lobe.  Was treated with course of azithromycin and cefpodoxime with improvement of her symptoms.  She also had associated cough with sputum production and wheezing which have improved.  She reports episodes of these respiratory exacerbations 1-2 times per year.  She denies any issues with seasonal allergies.  Cold air, strong perfumes or colognes do not bother her breathing.  No history of asthma in childhood.  She does report intermittent shortness of breath with exertion but this is not with every exertional effort.  Is any chest pain, orthopnea or paroxysmal nocturnal dyspnea.  She has history of rheumatoid arthritis and has been on Enbrel over the past year and has been on methotrexate for 25 years.  She is a never smoker.  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  She does report secondhand smoke in childhood.  Her mother had lung cancer at age 80.  Past Medical History:  Diagnosis Date   Chest pain    Pinched nerve in shoulder,  left    RA (rheumatoid arthritis) (HCC)    Transfusion history      Family History  Problem Relation Age of Onset   Heart failure Mother    Heart disease Mother    Lung cancer Mother 60       smoker   Heart disease Father    Breast cancer Maternal Grandmother 28   Diabetes Other    Heart disease Other    Colon cancer Neg Hx    Pancreatic cancer Neg Hx    Stomach cancer Neg Hx    Rectal cancer Neg Hx      Social History   Socioeconomic History   Marital status: Divorced    Spouse name: Not on file   Number of children: Not on file   Years of education: Not on file   Highest education level: Not on file  Occupational History   Not on file  Tobacco Use   Smoking status: Never   Smokeless tobacco: Never  Substance and Sexual Activity   Alcohol use: No   Drug use: No   Sexual activity: Not Currently    Birth control/protection: Post-menopausal  Other Topics Concern   Not on file  Social History Narrative   Not on file   Social Determinants of Health   Financial Resource Strain: Not on file  Food Insecurity: Not on file  Transportation Needs: Not on file  Physical Activity: Not on file  Stress: Not on file  Social Connections: Not on file  Intimate Partner Violence: Not on file     Allergies  Allergen Reactions   Amoxicillin Nausea And Vomiting    States she has had augmentin without issue     Outpatient Medications Prior to Visit  Medication Sig Dispense Refill   aspirin EC 81 MG tablet Take 81 mg by mouth daily.     atorvastatin (LIPITOR) 40 MG tablet TAKE 1 TABLET BY MOUTH EVERY DAY 90 tablet 3   BRILINTA 60 MG TABS tablet TAKE 1 TABLET BY MOUTH TWICE A DAY 180 tablet 3   Cetirizine HCl 10 MG CAPS Take by mouth.     ENBREL SURECLICK 50 MG/ML injection Inject into the skin once a week.     folic acid (FOLVITE) 1 MG tablet Take 1 mg by mouth daily.     methotrexate (RHEUMATREX) 2.5 MG tablet Take 12.5 mg by mouth once a week. Caution:Chemotherapy.  Protect from light.     metoprolol succinate (TOPROL-XL) 25 MG 24 hr tablet Take 1 tablet (25 mg total) by mouth daily. Take with or immediately following a meal. 90 tablet 1   nitroGLYCERIN (NITROSTAT) 0.4 MG SL tablet Place 0.4 mg under the tongue every 5 (five) minutes as needed for chest pain.     Omega-3 Fatty Acids (FISH OIL) 1000 MG CAPS Take 1,200 mg by mouth.      tretinoin (RETIN-A) 0.025 % cream Apply topically daily.     Turmeric (QC TUMERIC COMPLEX PO) Take 1,000 mg by mouth daily.     VITAMIN D PO Take 1,000 Units by mouth.     CALCIUM PO Take 600 mg by mouth daily.      UNABLE TO FIND supplement 8 greens     No facility-administered medications prior to visit.   Review of Systems  Constitutional:  Negative for chills, fever, malaise/fatigue and weight loss.  HENT:  Negative for congestion, sinus pain and sore throat.   Eyes: Negative.   Respiratory:  Positive for shortness of breath. Negative for cough, hemoptysis, sputum production and wheezing.   Cardiovascular:  Negative for chest pain, palpitations, orthopnea, claudication and leg swelling.  Gastrointestinal:  Negative for abdominal pain, heartburn, nausea and vomiting.  Genitourinary: Negative.   Musculoskeletal:  Negative for joint pain and myalgias.  Skin:  Negative for rash.  Neurological:  Negative for weakness.  Endo/Heme/Allergies: Negative.   Psychiatric/Behavioral: Negative.     Objective:   Vitals:   06/01/22 1011  BP: 124/72  Pulse: 63  SpO2: 100%  Weight: 148 lb (67.1 kg)  Height: 5' 6.25" (1.683 m)   Physical Exam Constitutional:      General: She is not in acute distress.    Appearance: She is not ill-appearing.  HENT:     Head: Normocephalic and atraumatic.  Eyes:     General: No scleral icterus.    Conjunctiva/sclera: Conjunctivae normal.  Cardiovascular:     Rate and Rhythm: Normal rate and regular rhythm.     Pulses: Normal pulses.     Heart sounds: Normal heart sounds. No murmur  heard. Pulmonary:     Effort: Pulmonary effort is normal.     Breath sounds: Normal breath sounds. No wheezing, rhonchi or rales.  Musculoskeletal:     Right lower leg: No edema.     Left lower leg: No edema.  Skin:    General: Skin is warm and dry.  Neurological:     General: No focal deficit present.  Mental Status: She is alert.  Psychiatric:        Mood and Affect: Mood normal.        Behavior: Behavior normal.        Thought Content: Thought content normal.        Judgment: Judgment normal.    CBC    Component Value Date/Time   WBC 7.1 01/30/2022 1908   RBC 3.90 01/30/2022 1908   HGB 13.0 01/30/2022 1908   HGB 13.5 04/11/2021 1041   HGB 14.2 10/14/2016 0924   HCT 38.3 01/30/2022 1908   HCT 39.7 04/11/2021 1041   PLT 163 01/30/2022 1908   PLT 247 04/11/2021 1041   MCV 98.2 01/30/2022 1908   MCV 99 (H) 04/11/2021 1041   MCH 33.3 01/30/2022 1908   MCHC 33.9 01/30/2022 1908   RDW 13.6 01/30/2022 1908   RDW 13.1 04/11/2021 1041   LYMPHSABS 1.0 01/30/2022 1908   MONOABS 0.6 01/30/2022 1908   EOSABS 0.0 01/30/2022 1908   BASOSABS 0.0 01/30/2022 1908      Latest Ref Rng & Units 03/31/2022    9:58 AM 01/30/2022    7:08 PM 04/11/2021   10:41 AM  BMP  Glucose 70 - 99 mg/dL 88   818   85    BUN 8 - 27 mg/dL 18   19   24     Creatinine 0.57 - 1.00 mg/dL 2.99   3.71   6.96    BUN/Creat Ratio 12 - 28 22    29     Sodium 134 - 144 mmol/L 141   138   144    Potassium 3.5 - 5.2 mmol/L 4.3   3.3   4.3    Chloride 96 - 106 mmol/L 105   102   105    CO2 20 - 29 mmol/L 23   25   25     Calcium 8.7 - 10.3 mg/dL 9.4   8.9   9.2     Chest imaging: HRCT Chest 05/27/22 1. Resolved lung opacities consistent with resolved multifocal pneumonia. 2. Minor linear subsegmental right middle lobe atelectasis. Lungs otherwise clear. 3. Coronary artery calcifications.  CTA Chest 01/30/22 1. Negative for pulmonary embolism. 2. Nodular opacities in the right upper lobe and, to a  lesser extent, in the left lower lobe, which are nonspecific but likely infectious or inflammatory. 3. Lower lobe predominant bronchial wall thickening, concerning for bronchitis.  PFT:    Latest Ref Rng & Units 06/01/2022    8:53 AM  PFT Results  FVC-Pre L 2.74    FVC-Predicted Pre % 84    FVC-Post L 2.73    FVC-Predicted Post % 84    Pre FEV1/FVC % % 76    Post FEV1/FCV % % 82    FEV1-Pre L 2.08    FEV1-Predicted Pre % 84    FEV1-Post L 2.24    DLCO uncorrected ml/min/mmHg 19.77    DLCO UNC% % 94    DLCO corrected ml/min/mmHg 19.77    DLCO COR %Predicted % 94    DLVA Predicted % 113    TLC L 5.56    TLC % Predicted % 103    RV % Predicted % 121    2023: Within normal limits  Labs:  Path:  Echo:  Heart Catheterization:       Assessment & Plan:   Lobar pneumonia, unspecified organism (HCC)  Shortness of breath  Discussion: Lanaia Breyette is a 71 year old woman, never smoker with  rheumatoid arthritis who returns to pulmonary clinic for cough.   Her cough has resolved clinically and her prior pneumonia has improved radiographically. She had reactive airways disease in setting of acute lung infection. Her symptoms are fully resolved at this time except for exertional dyspnea at times. Her PFTs are unremarkable today. We discussed the CT chest and PFT findings today. We also discussed the coronary atherosclerosis noted on the CT Chest and she has a DES stent placed previously in the LAD. She will be discussing the dyspnea with her cardiology team next.   Follow up as needed.   Melody Comas, MD [facility] Office: [phone]   Current Outpatient Medications:    aspirin EC 81 MG tablet, Take 81 mg by mouth daily., Disp: , Rfl:    atorvastatin (LIPITOR) 40 MG tablet, TAKE 1 TABLET BY MOUTH EVERY DAY, Disp: 90 tablet, Rfl: 3   BRILINTA 60 MG TABS tablet, TAKE 1 TABLET BY MOUTH TWICE A DAY, Disp: 180 tablet, Rfl: 3   Cetirizine HCl 10 MG  CAPS, Take by mouth., Disp: , Rfl:    ENBREL SURECLICK 50 MG/ML injection, Inject into the skin once a week., Disp: , Rfl:    folic acid (FOLVITE) 1 MG tablet, Take 1 mg by mouth daily., Disp: , Rfl:    methotrexate (RHEUMATREX) 2.5 MG tablet, Take 12.5 mg by mouth once a week. Caution:Chemotherapy. Protect from light., Disp: , Rfl:    metoprolol succinate (TOPROL-XL) 25 MG 24 hr tablet, Take 1 tablet (25 mg total) by mouth daily. Take with or immediately following a meal., Disp: 90 tablet, Rfl: 1   nitroGLYCERIN (NITROSTAT) 0.4 MG SL tablet, Place 0.4 mg under the tongue every 5 (five) minutes as needed for chest pain., Disp: , Rfl:    Omega-3 Fatty Acids (FISH OIL) 1000 MG CAPS, Take 1,200 mg by mouth. , Disp: , Rfl:    tretinoin (RETIN-A) 0.025 % cream, Apply topically daily., Disp: , Rfl:    Turmeric (QC TUMERIC COMPLEX PO), Take 1,000 mg by mouth daily., Disp: , Rfl:    VITAMIN D PO, Take 1,000 Units by mouth., Disp: , Rfl:

## 2022-06-01 NOTE — Patient Instructions (Addendum)
Your Breathing tests are within normal limits and your CT Chest scan shows resolution of the pneumonia.  Follow up as needed

## 2022-06-10 ENCOUNTER — Ambulatory Visit (INDEPENDENT_AMBULATORY_CARE_PROVIDER_SITE_OTHER): Payer: Medicare Other | Admitting: Physician Assistant

## 2022-06-10 ENCOUNTER — Encounter: Payer: Self-pay | Admitting: Physician Assistant

## 2022-06-10 DIAGNOSIS — I2510 Atherosclerotic heart disease of native coronary artery without angina pectoris: Secondary | ICD-10-CM | POA: Diagnosis not present

## 2022-06-10 DIAGNOSIS — R5383 Other fatigue: Secondary | ICD-10-CM | POA: Diagnosis not present

## 2022-06-10 DIAGNOSIS — Z79899 Other long term (current) drug therapy: Secondary | ICD-10-CM | POA: Diagnosis not present

## 2022-06-10 DIAGNOSIS — M7070 Other bursitis of hip, unspecified hip: Secondary | ICD-10-CM | POA: Diagnosis not present

## 2022-06-10 DIAGNOSIS — L988 Other specified disorders of the skin and subcutaneous tissue: Secondary | ICD-10-CM

## 2022-06-10 DIAGNOSIS — Z808 Family history of malignant neoplasm of other organs or systems: Secondary | ICD-10-CM | POA: Diagnosis not present

## 2022-06-10 DIAGNOSIS — M1990 Unspecified osteoarthritis, unspecified site: Secondary | ICD-10-CM | POA: Diagnosis not present

## 2022-06-10 DIAGNOSIS — Z1283 Encounter for screening for malignant neoplasm of skin: Secondary | ICD-10-CM | POA: Diagnosis not present

## 2022-06-10 DIAGNOSIS — M0600 Rheumatoid arthritis without rheumatoid factor, unspecified site: Secondary | ICD-10-CM | POA: Diagnosis not present

## 2022-06-10 DIAGNOSIS — M8580 Other specified disorders of bone density and structure, unspecified site: Secondary | ICD-10-CM | POA: Diagnosis not present

## 2022-06-10 MED ORDER — TRETINOIN 0.025 % EX CREA: TOPICAL_CREAM | Freq: Every day | CUTANEOUS | 11 refills | Status: AC

## 2022-06-10 NOTE — Progress Notes (Signed)
   New Patient   Subjective  Barbara Ayers is a 71 y.o. female who presents for the following: New Patient (Initial Visit) (Patient here today for a skin check no concerns. Patient states that she would like a prescription of RetinA also. No personal history of atypical moles, melanoma or non mole skin cancer. Family history of non mole skin cancer (son).).   The following portions of the chart were reviewed this encounter and updated as appropriate:  Tobacco  Allergies  Meds  Problems  Med Hx  Surg Hx  Fam Hx      Objective  Well appearing patient in no apparent distress; mood and affect are within normal limits.  A full examination was performed including scalp, head, eyes, ears, nose, lips, neck, chest, axillae, abdomen, back, buttocks, bilateral upper extremities, bilateral lower extremities, hands, feet, fingers, toes, fingernails, and toenails. All findings within normal limits unless otherwise noted below.  Head to Toe No atypical nevi or signs of NMSC noted at the time of the visit.   Head - Anterior (Face) Fine lines   Assessment & Plan  [place] Head - Anterior (Face)  tretinoin (RETIN-A) 0.025 % cream - Head - Anterior (Face) Apply topically daily.  Skin exam for malignant neoplasm Head to Toe  Yearly skin check     I, Esley Brooking, PA-C, have reviewed all documentation's for this visit.  The documentation on 06/10/22 for the exam, diagnosis, procedures and orders are all accurate and complete.

## 2022-06-19 DIAGNOSIS — R7303 Prediabetes: Secondary | ICD-10-CM | POA: Diagnosis not present

## 2022-06-19 DIAGNOSIS — Z0000 Encounter for general adult medical examination without abnormal findings: Secondary | ICD-10-CM | POA: Diagnosis not present

## 2022-06-19 DIAGNOSIS — R7989 Other specified abnormal findings of blood chemistry: Secondary | ICD-10-CM | POA: Diagnosis not present

## 2022-06-19 DIAGNOSIS — R5383 Other fatigue: Secondary | ICD-10-CM | POA: Diagnosis not present

## 2022-06-19 DIAGNOSIS — M069 Rheumatoid arthritis, unspecified: Secondary | ICD-10-CM | POA: Diagnosis not present

## 2022-06-19 DIAGNOSIS — I2510 Atherosclerotic heart disease of native coronary artery without angina pectoris: Secondary | ICD-10-CM | POA: Diagnosis not present

## 2022-06-19 DIAGNOSIS — Z79899 Other long term (current) drug therapy: Secondary | ICD-10-CM | POA: Diagnosis not present

## 2022-06-19 DIAGNOSIS — E7800 Pure hypercholesterolemia, unspecified: Secondary | ICD-10-CM | POA: Diagnosis not present

## 2022-06-19 DIAGNOSIS — R946 Abnormal results of thyroid function studies: Secondary | ICD-10-CM | POA: Diagnosis not present

## 2022-06-19 DIAGNOSIS — F411 Generalized anxiety disorder: Secondary | ICD-10-CM | POA: Diagnosis not present

## 2022-07-30 ENCOUNTER — Other Ambulatory Visit: Payer: Self-pay

## 2022-07-30 MED ORDER — METOPROLOL SUCCINATE ER 25 MG PO TB24: 25.0000 mg | ORAL_TABLET | Freq: Every day | ORAL | 1 refills | Status: DC

## 2022-08-24 ENCOUNTER — Encounter: Payer: Self-pay | Admitting: Cardiovascular Disease

## 2022-08-24 ENCOUNTER — Ambulatory Visit: Payer: Medicare Other | Attending: Cardiovascular Disease | Admitting: Cardiovascular Disease

## 2022-08-24 VITALS — BP 126/28 | HR 61 | Ht 66.5 in | Wt 151.6 lb

## 2022-08-24 DIAGNOSIS — I471 Supraventricular tachycardia: Secondary | ICD-10-CM | POA: Diagnosis present

## 2022-08-24 DIAGNOSIS — M069 Rheumatoid arthritis, unspecified: Secondary | ICD-10-CM | POA: Diagnosis present

## 2022-08-24 DIAGNOSIS — Z9861 Coronary angioplasty status: Secondary | ICD-10-CM | POA: Diagnosis present

## 2022-08-24 DIAGNOSIS — I2510 Atherosclerotic heart disease of native coronary artery without angina pectoris: Secondary | ICD-10-CM | POA: Diagnosis present

## 2022-08-24 DIAGNOSIS — E785 Hyperlipidemia, unspecified: Secondary | ICD-10-CM | POA: Diagnosis present

## 2022-08-24 NOTE — Patient Instructions (Signed)
Medication Instructions:  Continue same medications   Lab Work: None ordered   Testing/Procedures: None ordered   Follow-Up: At [facility], you and your health needs are our priority.  As part of our continuing mission to provide you with exceptional heart care, we have created designated Provider Care Teams.  These Care Teams include your primary Cardiologist (physician) and Advanced Practice Providers (APPs -  Physician Assistants and Nurse Practitioners) who all work together to provide you with the care you need, when you need it.  We recommend signing up for the patient portal called "MyChart".  Sign up information is provided on this After Visit Summary.  MyChart is used to connect with patients for Virtual Visits (Telemedicine).  Patients are able to view lab/test results, encounter notes, upcoming appointments, etc.  Non-urgent messages can be sent to your provider as well.   To learn more about what you can do with MyChart, go to [email or web address].    Your next appointment:  As Needed    The format for your next appointment: Office   Provider:  [facility]   Important Information About Sugar

## 2022-08-24 NOTE — Progress Notes (Signed)
Cardiology Office Note    Date:  08/24/2022   ID:  Barbara Ayers 11-02-1951, MRN [id number]  PCP:  Glenis Smoker, MD  Cardiologist:  Shelva Majestic, MD   5 month F/U cardiology evaluation: Initially referred by [name] at [facility]  History of Present Illness:  Barbara Ayers is a 71 y.o. female who I saw on September 14, 2017 through the referral of  [name] to establish cardiology care.  I last saw her in July 2022.  Barbara Ayers is an active female who has a history of rheumatoid arthritis for over 20 years.  She has routinely exercised.  In 2018 she began to notice exertional symptoms of vague chest tightness when running.  She is a personal friend of [name] and was evaluated by her in [place], [place].  She underwent an echo Doppler study which showed an EF of 55% with normal diastolic function.  There was trace to mild aortic insufficiency, mild MR and TR.  She was referred for a nuclear stress test which revealed equivocal findings for anteroseptal ischemia, although she did experience chest pain and had ischemic ECG abnormalities and probable rate related bundle branch block and a brief episode of nonsustained VT.  Her chest pain required approximately 55 minutes for complete resolution in recovery.  She  was referred to [facility]. [name] at [facility] in cardiac catheterization on 04/15/2017 showed a 99% ostial stenosis of her LAD which was successfully stented with a 3.012 mm Resolute onyx DES stent postdilated to 3.25 mm.  She was also found to have mild 20% ostial left main narrowing, 40% mid LAD stenosis and 20% mid RCA stenosis.  Following her intervention, she has been pain-free.  She is back exercising and denies any exertional symptomatology.  She has seen Dr. Mathis Bud in follow-up.  Since the patient lives in [place] she was referred by Dr. Wendi Snipes to me to continue her cardiology care.  When I  saw her for initial evaluation in September 2018 she was doing well.  She specifically denies recurrent anginal symptoms, palpitations, presyncope or syncope.  She is followed by Dr. Inda Merlin for primary care.    When seen in October 2019 she remained stable and continued to be active.  She was doing yoga several times per week and was not been running as much as she had in the past.  Recently she has started to notice a vague left upper chest sensation which seems to be exacerbated when she lifts her arms above her head suggesting a possible musculoskeletal etiology.  However this is new.  She is unaware of any definitive exertional precipitation.  She has continued to be on aspirin plus Brilinta 90 mg twice a day and denies bleeding.  She continues to be on methotrexate weekly.  She is on Prozac 10 mg daily.    She underwent a nuclear stress test on November 01, 2018.  She was found to have a very small defect of mild severity in the anterior to apical anterior segment felt  consistent with breast attenuation.  There was no ischemia.  Post stress ejection fraction was 58%.  There were no diagnostic ST segment changes of ischemia.    She was seen in follow-up in December 2019 at which time her chest pain had resolved.  I reviewed her nuclear images with her in detail.    She was seen in August 2020 by  HaoMeng, PA and continue to do well.  She was having occasional back discomfort attributed to muscle spasm.  I saw her in May 2021 and over the prior year she had continued to do well from a cardiac standpoint.  She is followed by Dr. Dossie Der for her rheumatoid arthritis.  She denied any recurrent anginal symptoms or shortness of breath.  She was concerned about an abdominal aortic aneurysm since 3 family members had developed this.  She continued to be on atorvastatin 40 mg for hyperlipidemia with target LDL less than 70.  She was on Enbrel for rheumatoid arthritis in addition to methotrexate.  She continues to be  on aspirin and low-dose Brilinta at 60 mg twice a day.    She underwent an abdominal aortic ultrasound on May 13, 2020 which was normal and did not show any evidence for abdominal aortic aneurysm.  Was ectasia of a right common iliac artery and left common iliac artery.  Heart is aortic measurement was 2.4 cm.  I saw her in April 2022 she had been doing fairly well until several weeks prior to that evaluation when she noticed intermittent tachypalpitations over 4-day.period which would have an abrupt onset and abrupt discontinuance.  On one occasion she felt her heart rate increased to 131 bpm.  Several of these episodes lasted several hours for cessation and they all subsided spontaneously.  She denied any recent caffeine use.  She was sleeping well.  She continued to jog at a very slow pace typically at a pace of 1 mile in 15 minutes.  He denied chest tightness or pressure.  During that evaluation I recommended that she wear a 2-week Zio patch monitor, recommended laboratory, and a 2D echo Doppler study.  She wore a Zio patch monitor from April 21 through May 01, 2021.  The predominant rhythm was sinus rhythm with an average heart rate at 71 bpm.  The slowest heart rate was sinus bradycardia at 44 bpm which occurred at 6 AM while she was still sleeping.  The maximum heart rate was 171.  She had 118 short bursts of SVT with the run with the fastest interval lasting 4 beats with a maximum rate of 171.  The longest episode lasted 11.6 seconds with an average rate of 136.  Some of the episodes of SVT may possibly have been atrial tachycardia with variable block.  There appeared to be an episode of atrial flutter which lasted for 5 minutes and 28 seconds at an average rate at 139.  There were rare isolated PACs, atrial couplets there were rare PAC, PVCs.  Laboratory showed normal chemistry with creatinine 0.82.  Potassium 4.3.  Hemoglobin hematocrit were stable at 13.5 and 39.7.  Magnesium was 2.4.  Lipid studies  were excellent with total cholesterol 138, triglycerides 52, HDL 67, and LDL 59.  A 2D echo Doppler study was done on May 30, 2021 which showed an EF of 60 to 65% with mild concentric LVH.  There was mild mitral regurgitation.  There was borderline dilatation of the aortic root.  I last saw her on July 03, 2021 at which time she felt well and was unaware of any  tachypalpitations except on one occasion when she did take metoprolol titrate 25 mg with prompt relief.  She continued to be fairly active working in the yard.  She does admit to some fatigue.  She believes she is sleeping well.  She denied any chest pain or exertional dyspnea.  Since I last saw  her, she was evaluated by Coletta Memos on August 05, 2021 and due to some interscapular tightness, left shoulder discomfort a subsequent nuclear stress test was recommended which was performed on August 12, 2021 and remained low risk with EF 53% and no ST-T abnormalities.  She saw him for follow-up on August 19, 2021 and remained stable.  She underwent a follow-up echo Doppler study on October 31, 2021 which showed an EF of 64% with grade 1 diastolic dysfunction.  She had normal strain.  She had normal wall motion.  There was mild aortic sclerosis.  She had undergone an ER evaluation with cough, fever and palpitations.  A CT ruled out pulmonary embolism and showed nodular opacities in the right upper lobe and left lower lobe.  She was treated with antibiotic therapy.  She was subsequently evaluated by Dr. Cherrie Distance of pulmonology on February 24, 2022.  She was felt that she had a "community-acquired pneumonia which was appropriately treated.  She has a history of rheumatoid arthritis and has been on Enbrel in addition to methotrexate for years.  It was recommended she undergo a follow-up chest CT and subsequent pulmonary function studies in the future.  I last saw her on March 23, 2022. At that time she was experiencing some shortness of breath when  walking upstairs.  She denies any chest pressure.  She admits to some tightness of her trapezius muscle region.  Her palpitations did improve with beta-blocker therapy.  Presently she is on atorvastatin 40 mg for hyperlipidemia, metoprolol tartrate and has been taking this 25 mg daily.  She is on aspirin/low-dose Brilinta for DAPT.  She continues to be on methotrexate and Enbrel for her rheumatoid arthritis.  During that evaluation, recommended follow-up laboratory when checked on March 31, 2022 revealed excellent lipid studies with total cholesterol 128, triglycerides 64, HDL 63, LDL 52.  I also checked an LP(a) which was normal at 29.8.  TSH was minimally increased at 5.15.  Since I last saw Barbara Ayers, she has moved to [place] and lives near [place].  She would like to transfer care to [facility] since he is now living there.  Presently she has remained stable.  She denies any chest pain or shortness of breath.  She has continued to be on low-dose aspirin/Brilinta 60 twice daily, metoprolol succinate 25 mg daily.  She is on atorvastatin 40 mg and over-the-counter fish oil.  She continues to be on methotrexate 12.5 mg weekly.  She has had arthritic issues of her back and hips.  She is unaware of any presyncope or syncope.  She denies palpitations or recurrent chest pain.  She presents for follow-up evaluation.  Past Medical History:  Diagnosis Date   Chest pain    Pinched nerve in shoulder, left    RA (rheumatoid arthritis) ([place])    Transfusion history     Past Surgical History:  Procedure Laterality Date   CARPAL TUNNEL RELEASE     CORONARY ANGIOPLASTY     stint   TENDON RELEASE Right 2006    Current Medications: Outpatient Medications Prior to Visit  Medication Sig Dispense Refill   aspirin EC 81 MG tablet Take 81 mg by mouth daily.     atorvastatin (LIPITOR) 40 MG tablet TAKE 1 TABLET BY MOUTH EVERY DAY 90 tablet 3   BRILINTA 60 MG TABS tablet TAKE 1 TABLET BY MOUTH TWICE A DAY 180  tablet 3   ENBREL SURECLICK 50 MG/ML injection Inject into the skin once  a week.     folic acid (FOLVITE) 1 MG tablet Take 1 mg by mouth daily.     methotrexate (RHEUMATREX) 2.5 MG tablet Take 12.5 mg by mouth once a week. Caution:Chemotherapy. Protect from light.     metoprolol succinate (TOPROL-XL) 25 MG 24 hr tablet Take 1 tablet (25 mg total) by mouth daily. Take with or immediately following a meal. 90 tablet 1   nitroGLYCERIN (NITROSTAT) 0.4 MG SL tablet Place 0.4 mg under the tongue every 5 (five) minutes as needed for chest pain.     Omega-3 Fatty Acids (FISH OIL) 1000 MG CAPS Take 1,200 mg by mouth.      tretinoin (RETIN-A) 0.025 % cream Apply topically daily. 45 g 11   VITAMIN D PO Take 1,000 Units by mouth.     Cetirizine HCl 10 MG CAPS Take by mouth.     Turmeric (QC TUMERIC COMPLEX PO) Take 1,000 mg by mouth daily.     No facility-administered medications prior to visit.     Allergies:   Amoxicillin-pot clavulanate and Amoxicillin   Social History   Socioeconomic History   Marital status: Divorced    Spouse name: Not on file   Number of children: Not on file   Years of education: Not on file   Highest education level: Not on file  Occupational History   Not on file  Tobacco Use   Smoking status: Never   Smokeless tobacco: Never  Substance and Sexual Activity   Alcohol use: No   Drug use: No   Sexual activity: Not Currently    Birth control/protection: Post-menopausal  Other Topics Concern   Not on file  Social History Narrative   Not on file   Social Determinants of Health   Financial Resource Strain: Not on file  Food Insecurity: Not on file  Transportation Needs: Not on file  Physical Activity: Not on file  Stress: Not on file  Social Connections: Not on file    Additional social history is notable in that she is divorced.  She has 2 children and 2 grandchildren.  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  There is no history of  tobacco use or alcohol use.  She exercises at least 5 days per week.  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  Her father had suffered an initial MI at age 86. She has 2 sisters who are ages 4 and 32.  ROS General: Negative; No fevers, chills, or night sweats;  HEENT: Negative; No changes in vision or hearing, sinus congestion, difficulty swallowing Pulmonary: Negative; No cough, wheezing, shortness of breath, hemoptysis Cardiovascular: See history of present illness GI: Negative; No nausea, vomiting, diarrhea, or abdominal pain GU: Negative; No dysuria, hematuria, or difficulty voiding Musculoskeletal: Positive for rheumatoid arthritis Hematologic/Oncology: Negative; no easy bruising, bleeding Endocrine: Negative; no heat/cold intolerance; no diabetes Neuro: Negative; no changes in balance, headaches Skin: Negative; No rashes or skin lesions Psychiatric: Negative; No behavioral problems, depression Sleep: Negative; No snoring, daytime sleepiness, hypersomnolence, bruxism, restless legs, hypnogognic hallucinations, no cataplexy Other comprehensive 14 point system review is negative.   PHYSICAL EXAM:   VS:  BP Marland Kitchen)  126/28   Pulse 61   Ht 5' 6.5" (1.689 m)   Wt 151 lb 9.6 oz (68.8 kg)   LMP 08/10/1998 (Exact Date)   BMI 24.10 kg/m     Repeat blood pressure by me was 128/70  Wt Readings from Last 3 Encounters:  08/24/22 151 lb 9.6 oz (68.8 kg)  06/01/22 148 lb (67.1 kg)  03/23/22 150 lb (68 kg)   General: Alert, oriented, no distress.  Skin: normal turgor, no rashes, warm and dry HEENT: Normocephalic, atraumatic. Pupils equal round and reactive to light;  sclera anicteric; extraocular muscles intact;  Nose without nasal septal hypertrophy Mouth/Parynx benign; Mallinpatti scale 2 Neck: No JVD, no carotid bruits; normal carotid upstroke Lungs: clear to ausculatation and percussion; no wheezing or rales Chest wall: without tenderness to palpitation Heart: PMI not displaced, RRR, s1 s2 normal, 1/6 systolic murmur, no diastolic murmur, no rubs, gallops, thrills, or heaves Abdomen: soft, nontender; no hepatosplenomehaly, BS+; abdominal aorta nontender and not dilated by palpation. Back: no CVA tenderness Pulses 2+ Musculoskeletal: full range of motion, normal strength, no joint deformities Extremities: no clubbing cyanosis or edema, Homan's sign negative  Neurologic: grossly nonfocal; Cranial nerves grossly wnl Psychologic: Normal mood and affect     Studies/Labs Reviewed:   August 24, 2022 ECG (independently read by me): NSR at 61, no ectopy, normal intervals,   March 23, 2022 ECG (independently read by me): NSR at 62, no ectopy  July 2022 ECG (independently read by me): NSR at 67; no ectopy;  QTc interval 431 Barbara.  April 11, 2021 ECG (independently read by me): Sinus bradycardia at 53; no ectopy, normal intervals  Apr 30, 2020 2021 ECG (independently read by me): Normal sinus rhythm at 65 bpm.  No ectopy.  Normal intervals.  No ST segment changes.  October 17, 2018 ECG (independently read by me): Sinus bradycardia 58 bpm.  Normal intervals.  No ectopy.  No significant ST-T abnormalities.  September 14, 2017 ECG (independently read by me): normal sinus rhythm at 72 bpm.  Normal intervals.  Mild RV conduction delay.  No significant ST T abnormalities.  Recent Labs:    Latest Ref Rng & Units 03/31/2022    9:58 AM 01/30/2022    7:08 PM 04/11/2021   10:41 AM  BMP  Glucose 70 - 99 mg/dL 88  108  85   BUN 8 - 27 mg/dL $Remove'18  19  24   'QdrHBeQ$ Creatinine 0.57 - 1.00 mg/dL 0.83  0.82  0.82   BUN/Creat Ratio 12 - $Re'28 22   29   'JHC$ Sodium 134 - 144 mmol/L 141   138  144   Potassium 3.5 - 5.2 mmol/L 4.3  3.3  4.3   Chloride 96 - 106 mmol/L 105  102  105   CO2 20 - 29 mmol/L $RemoveB'23  25  25   'OhijIWkd$ Calcium 8.7 - 10.3 mg/dL 9.4  8.9  9.2         Latest Ref Rng & Units 03/31/2022    9:58 AM 01/30/2022    7:08 PM 04/11/2021   10:41 AM  Hepatic Function  Total Protein 6.0 - 8.5 g/dL 6.8  7.4  7.7   Albumin 3.8 - 4.8 g/dL 4.6  4.2  4.8   AST 0 - 40 IU/L $Remov'27  22  25   'KBNRff$ ALT 0 - 32 IU/L $Remov'22  12  18   'dqemwi$ Alk Phosphatase 44 - 121 IU/L 75  49  79   Total Bilirubin 0.0 -  1.2 mg/dL 0.9  0.9  0.5        Latest Ref Rng & Units 01/30/2022    7:08 PM 04/11/2021   10:41 AM 09/16/2017    8:28 AM  CBC  WBC 4.0 - 10.5 K/uL 7.1  3.6  4.3   Hemoglobin 12.0 - 15.0 g/dL 13.0  13.5  13.1   Hematocrit 36.0 - 46.0 % 38.3  39.7  39.0   Platelets 150 - 400 K/uL 163  247  239    Lab Results  Component Value Date   MCV 98.2 01/30/2022   MCV 99 (H) 04/11/2021   MCV 95 09/16/2017   Lab Results  Component Value Date   TSH 5.150 (H) 03/31/2022   Lab Results  Component Value Date   HGBA1C 5.4 10/23/2019     BNP No results found for: "BNP"  ProBNP No results found for: "PROBNP"   Lipid Panel     Component Value Date/Time   CHOL 128 03/31/2022 0957   TRIG 64 03/31/2022 0957   HDL 63 03/31/2022 0957   CHOLHDL 2.0 03/31/2022 0957   CHOLHDL 3.0 10/14/2016 1410   VLDL 12 10/14/2016 1410   LDLCALC 52 03/31/2022 0957     RADIOLOGY: No results found.   Additional studies/ records that were reviewed today include:  I reviewed the [facility] [place].  I reviewed the patient's noninvasive studies and cardiac catheterization and PCI records.   ECHO 05/06/2021 IMPRESSIONS   1. Left ventricular ejection fraction, by estimation, is 60 to 65%. The  left ventricle has normal function. The left ventricle has no regional  wall motion abnormalities. There is mild concentric left ventricular  hypertrophy. Left ventricular diastolic   parameters are indeterminate.   2. Right ventricular systolic function is normal. The right ventricular  size is normal.   3. The mitral valve is normal in structure. Mild mitral valve  regurgitation. No evidence of mitral stenosis.   4. The aortic valve was not well visualized. Aortic valve regurgitation  is mild. No aortic stenosis is present.   5. Aortic dilatation noted. There is borderline dilatation of the aortic  root.   ECHO: 10/31/2021 1. Left ventricular ejection fraction, by estimation, is 60 to 65%. Left  ventricular ejection fraction by 3D volume is 64 %. The left ventricle has  normal function. The left ventricle has no regional wall motion  abnormalities. Left ventricular diastolic   parameters are consistent with Grade I diastolic dysfunction (impaired  relaxation). The average left ventricular global longitudinal strain is  -25.4 %. The global longitudinal strain is normal.   2. Right ventricular systolic function is normal. The right ventricular  size is mildly enlarged. Tricuspid regurgitation signal is inadequate for  assessing PA pressure.   3. The mitral valve is normal in structure. Trivial mitral valve  regurgitation. No evidence of mitral stenosis.   4. The aortic valve is tricuspid. There is mild calcification of the  aortic valve. There is mild thickening of the aortic valve. Aortic valve  regurgitation is not visualized. Mild aortic valve sclerosis is present,  with no evidence of aortic valve  stenosis.   5. The inferior vena cava is normal in size with greater than 50%  respiratory variability, suggesting right atrial pressure of 3 mmHg.   Comparison(s): 05/30/21 EF 60-65%. Aortic root is within normal limits for  age and body surface area.   ASSESSMENT:    1. CAD S/P percutaneous coronary angioplasty  04/15/2017 with DES stent to LAD ostium   2. Hyperlipidemia with target LDL less than 70   3. Rheumatoid arthritis, involving unspecified site,  unspecified whether rheumatoid factor present ([place])   4. SVT (supraventricular tachycardia) [facility])     PLAN:  Barbara Ayers is a very pleasant 70 -year-old female who has a greater than 20 year history of rheumatoid arthritis and developed new onset exertional chest pressure which occurred while running in 2018.  She was found to have a 99% ostial LAD stenosis as the culprit lesion contributing to her symptomatology and underwent successful DES stenting with insertion of a Resolute Onyx 3.012 mm DES stent on 04/15/2017.  When I saw her for initial evaluation she was without recurrent anginal symptomatology and had resumed physical activity and denied any exertional symptoms.  On her initial stress test she also had experienced an episode of nonsustained VT. mostly, she denies any palpitations.  She had experienced some episodes of atypical chest discomfort.  At her last office visit with me in March 2023 she had significant tightness in the trapezius distribution of her upper back musculature.  Subsequent to her intervention she had undergone several nuclear studies which have remained low risk.  She had experienced palpitations and previous Zio patch monitor had shown predominant sinus rhythm with a brief episode of SVT or atrial tachycardia.  She developed community-acquired pneumonia leading to ER evaluation at the end of January 2023 for which she was treated with antibiotics.  CT was negative for PE and demonstrated there was evidence for nodular opacities in the right upper lobe and to a lesser stent left lower lobe, nonspecific but likely infectious or inflammatory.  She also had lower lobe predominant bronchial wall thickening concerning for bronchitis.  She has undergone pulmonary evaluation and in the future will undergo repeat CT imaging and pulmonary function studies per Dr. Erin Fulling.  Her echo Doppler study on October 31, 2021 continue to show excellent LV function with EF by 3D volume at 64%,  normal wall motion and grade 1 diastolic dysfunction.  There is mild aortic sclerosis.  Presently, her blood pressure is well controlled and I repeat by me 128/70.  Her resting heart rate is stable and she is tolerating metoprolol succinate 25 mg daily.  Amatory from April 2023 was reviewed.  Lipid studies are excellent with LDL at 52.  LP(a) was normal at 29.8.  LFTs remain stable.  CBC was excellent.  She had her TSH rechecked by her primary physician and this was reportedly normal.  Since she has moved to [facility], she managed to establish care.  I suggested Dr. Mancel Parsons at [place], who I understand her sister already sees.  I was available to help her in the future as needed.  It has been a pleasure in taking care of her for these last several years and I wished her my very best.   Medication Adjustments/Labs and Tests Ordered: Current medicines are reviewed at length with the patient today.  Concerns regarding medicines are outlined above.  Medication changes, Labs and Tests ordered today are listed in the Patient Instructions below.  Patient Instructions  Medication Instructions:  Continue same medications   Lab Work: None ordered   Testing/Procedures: None ordered   Follow-Up: At [facility], you and your health needs are our priority.  As part of our continuing mission to provide you with exceptional heart care, we have created designated Provider Care Teams.  These Care Teams include your primary  Cardiologist (physician) and Advanced Practice Providers (APPs -  Physician Assistants and Nurse Practitioners) who all work together to provide you with the care you need, when you need it.  We recommend signing up for the patient portal called "MyChart".  Sign up information is provided on this After Visit Summary.  MyChart is used to connect with patients for Virtual Visits (Telemedicine).  Patients are able to view lab/test results, encounter notes, upcoming appointments, etc.   Non-urgent messages can be sent to your provider as well.   To learn more about what you can do with MyChart, go to [email or web address].    Your next appointment:  As Needed    The format for your next appointment: Office   Provider:  [facility]   Important Information About Sugar         Signed, Shelva Majestic, MD, [facility]  08/24/2022 5:53 PM    [facility] [address] [place], [place], [place]  [address] Phone: [phone]

## 2022-08-25 ENCOUNTER — Telehealth: Payer: Self-pay

## 2022-08-25 NOTE — Telephone Encounter (Signed)
Called patient left message on personal voice mail your address already changed in your chart.I spoke to front desk and was told the reason they did not take your copay yesterday the payment system down.You will receive a bill in the mail.

## 2022-09-08 ENCOUNTER — Telehealth: Payer: Self-pay | Admitting: Cardiovascular Disease

## 2022-09-08 MED ORDER — TICAGRELOR 60 MG PO TABS: 60.0000 mg | ORAL_TABLET | Freq: Two times a day (BID) | ORAL | 3 refills | Status: AC

## 2022-09-08 MED ORDER — METOPROLOL SUCCINATE ER 25 MG PO TB24: 25.0000 mg | ORAL_TABLET | Freq: Every day | ORAL | 3 refills | Status: AC

## 2022-09-08 MED ORDER — ATORVASTATIN CALCIUM 40 MG PO TABS
40.0000 mg | ORAL_TABLET | Freq: Every day | ORAL | 3 refills | Status: AC
Start: 2022-09-08 — End: ?

## 2022-09-08 NOTE — Telephone Encounter (Signed)
*  STAT* If patient is at the pharmacy, call can be transferred to refill team.   1. Which medications need to be refilled? (please list name of each medication and dose if known) metoprolol succinate (TOPROL-XL) 25 MG 24 hr tablet  BRILINTA 60 MG TABS tablet  atorvastatin (LIPITOR) 40 MG tablet  2. Which pharmacy/location (including street and city if local pharmacy) is medication to be sent to? [id number] - [place], [place] - [address]  3. Do they need a 30 day or 90 day supply? 90

## 2023-01-21 ENCOUNTER — Telehealth: Payer: Self-pay | Admitting: Pulmonary Disease

## 2023-01-21 NOTE — Telephone Encounter (Signed)
Called and spoke with pt who states she tested positive for covid 2 days ago. Symptoms first began 3 days ago.  Pt states she started with a runny nose and not feeling well and states she is now coughing getting up phlegm that is pale yellow in color. Denies any complaints of wheezing, no SOB, no current fever.  Pt states she was started on molnupiravir which was prescribed by PCP which she has now been on for 2 days.  Pt said she is feeling some better after being on that med but since she is coughing up phlegm that has color to it, she is wanting to know if she could get an abx to see if that would fully help her out.  Dr. Erin Fulling, please advise.

## 2023-01-21 NOTE — Telephone Encounter (Signed)
I would not recommend an antibiotic at this time since you are being treated for covid infection. This is likely the main source of your cough and phlegm production. This will get better with time.   Hope you feel better soon, JD

## 2023-01-21 NOTE — Telephone Encounter (Signed)
Pt is calling w/Bronchitis, she thinks. Usually this starts as a cough and calls for a Antibx to help knock it out. Dr. D told her to call us NOT PCP if she feels sick.  Would like something called in. Refused appt.  [phone]  Covid Poss.      [place]

## 2023-01-21 NOTE — Telephone Encounter (Signed)
Called and spoke with patient. She verbalized understanding.   Nothing further needed at time of call.

## 2023-12-12 IMAGING — CT CT HEAD W/O CM
4 series · 16 of 47 positions shown, 18 images · non-contrast
Comparison: None.

CLINICAL DATA: Fell down steps, swelling and bruising over left eye

EXAM:
CT HEAD WITHOUT CONTRAST
CT MAXILLOFACIAL WITHOUT CONTRAST
TECHNIQUE: Multidetector CT imaging of the head and maxillofacial structures
were performed using the standard protocol without intravenous
contrast. Multiplanar CT image reconstructions of the maxillofacial
structures were also generated.
RADIATION DOSE REDUCTION: This exam was performed according to the
departmental dose-optimization program which includes automated
exposure control, adjustment of the mA and/or kV according to
patient size and/or use of iterative reconstruction technique.

[Series 2: head wo · axial · 0.42mm/px · z∈[-157,-42]mm · 7 of 31 slices shown, 9 images]
[im 4/31  brain]
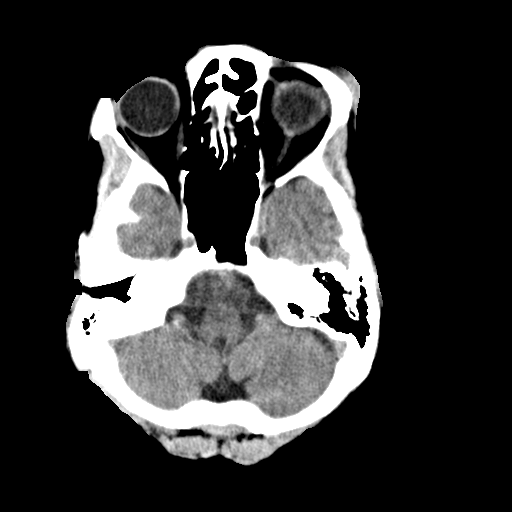
[im 4/31  bone]
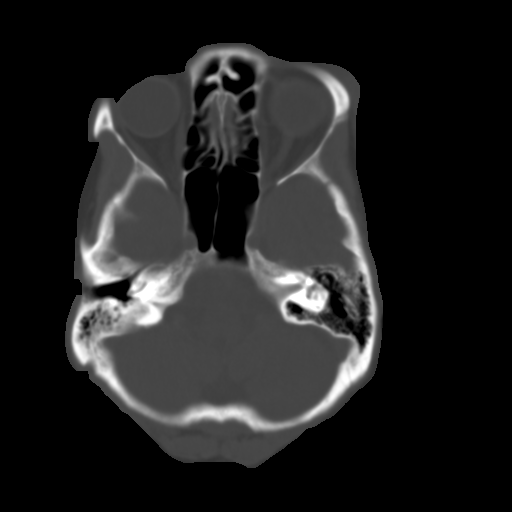
[im 8/31  brain]
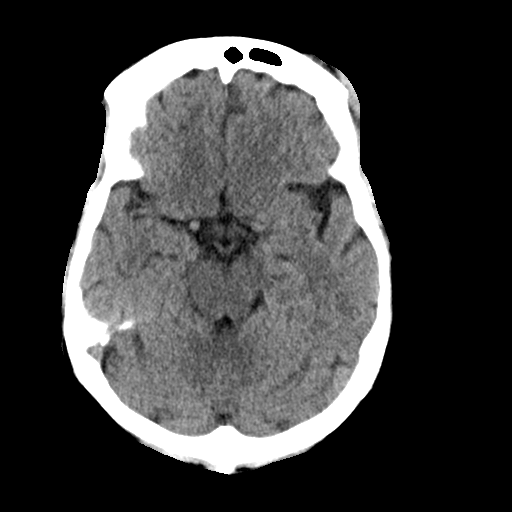
[im 12/31  brain]
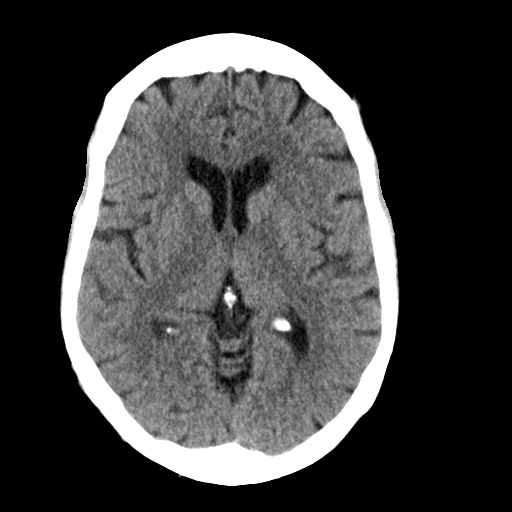
[im 16/31  brain]
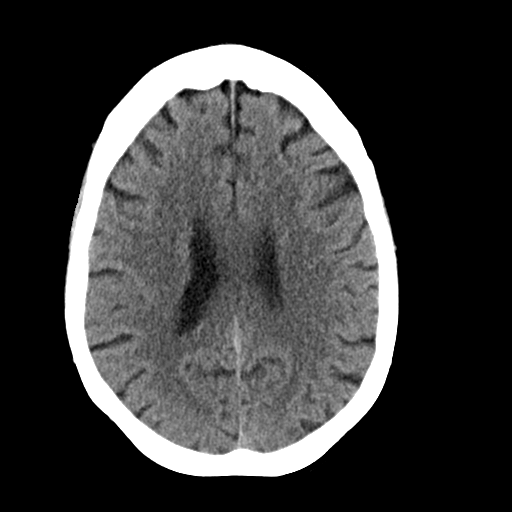
[im 19/31  brain]
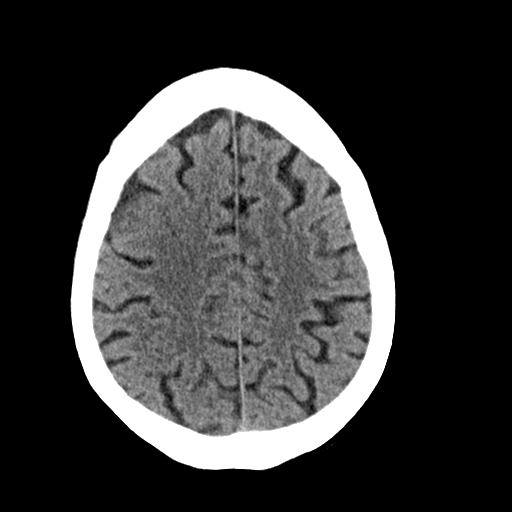
[im 19/31  bone]
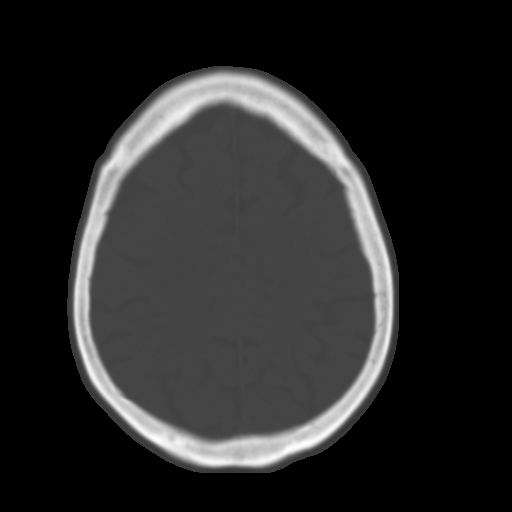
[im 23/31  brain]
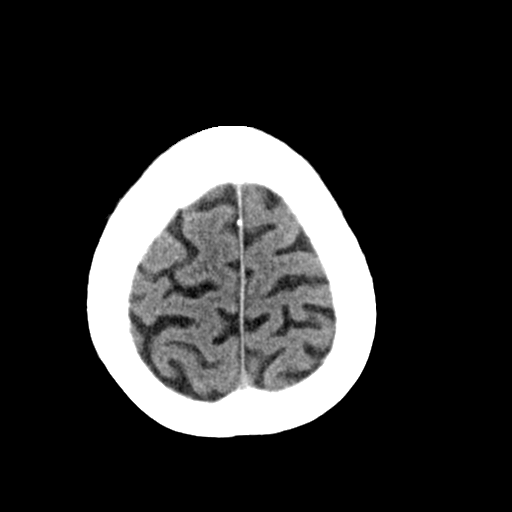
[im 27/31  brain]
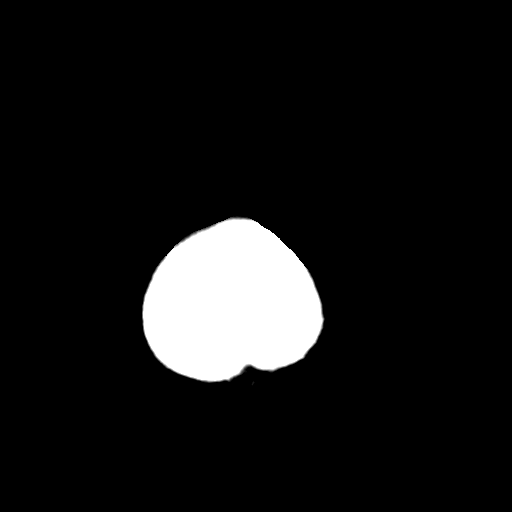

[Series 3: head bone · axial · 0.42mm/px · z∈[-158,-128]mm · 3 of 76 slices shown]
[im 8/76  bone]
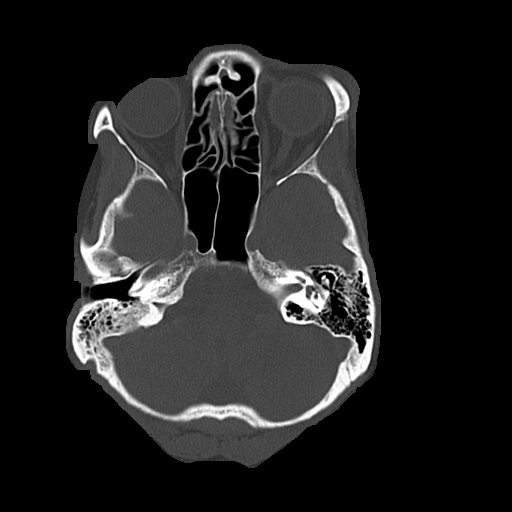
[im 16/76  bone]
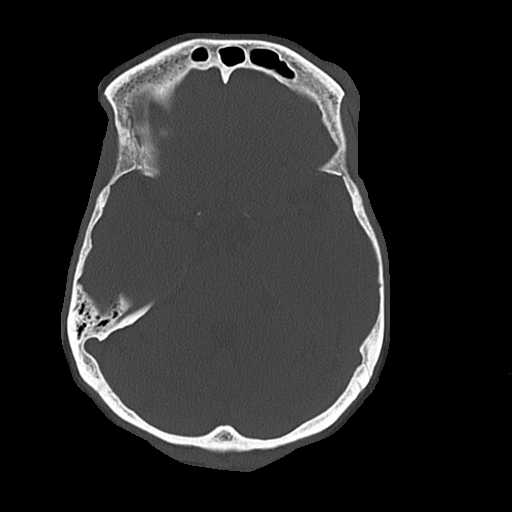
[im 23/76  bone]
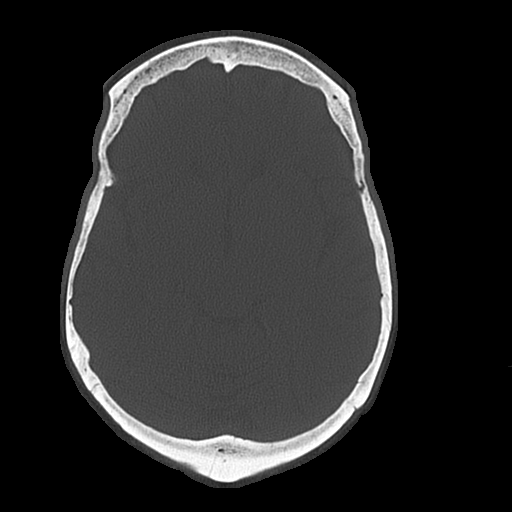

[Series 4: coronal soft · coronal · 0.33mm/px · 3 of 67 slices shown]
[im 23/67  brain]
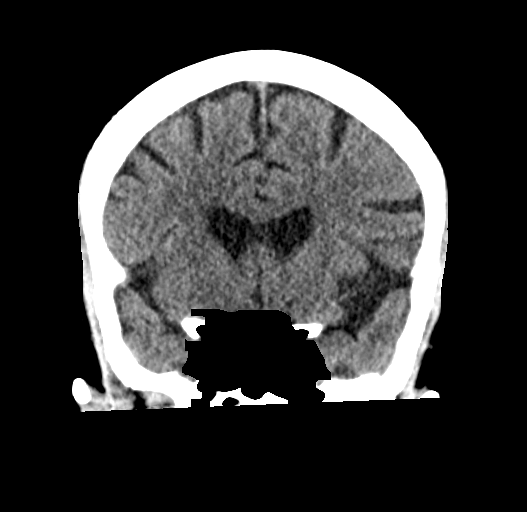
[im 30/67  brain]
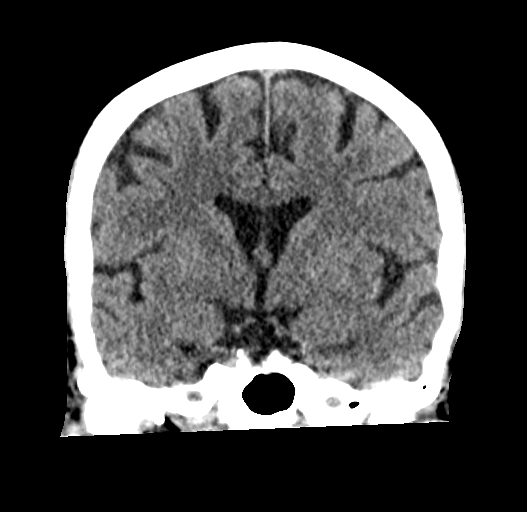
[im 37/67  brain]
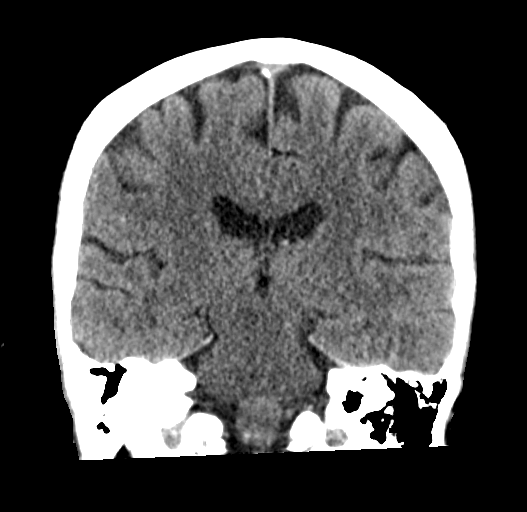

[Series 5: sagittal soft · sagittal · 0.33mm/px · 3 of 59 slices shown]
[im 20/59  brain]
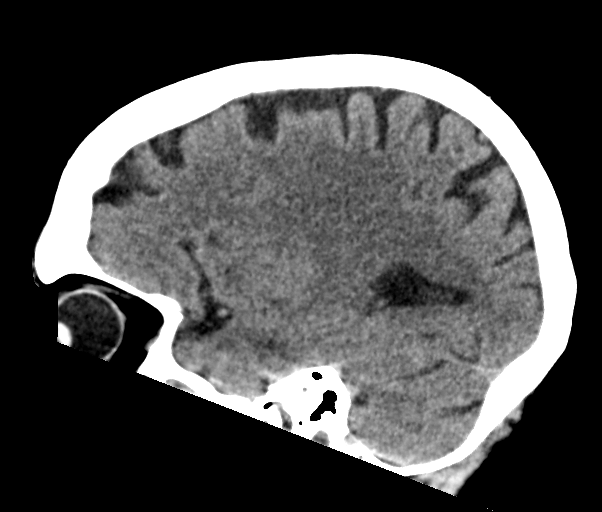
[im 30/59  brain]
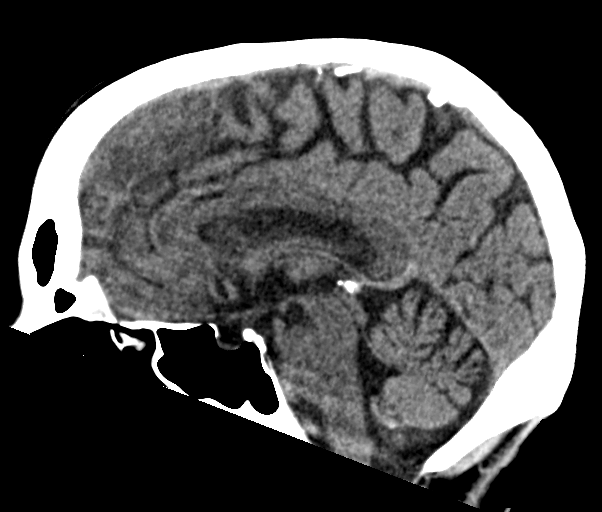
[im 39/59  brain]
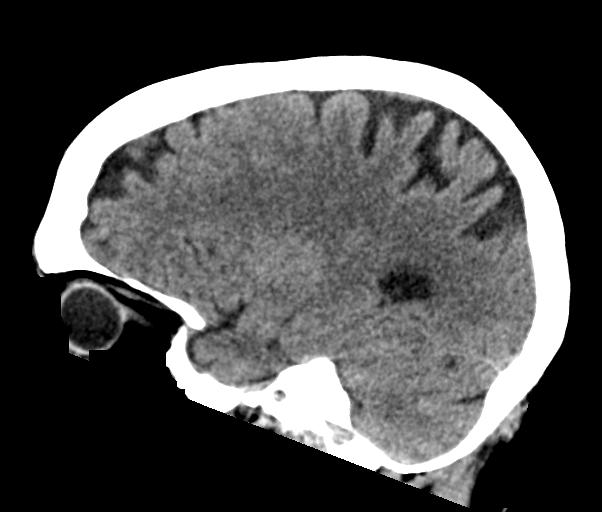

[16 of 47 positions shown; findings below may reference images not displayed]

FINDINGS: CT HEAD FINDINGS

Brain: No evidence of acute infarction, hemorrhage, hydrocephalus,
extra-axial collection or mass lesion/mass effect.

Vascular: No hyperdense vessel or unexpected calcification.

Skull: Normal. Negative for fracture or focal lesion.

Other: None.

CT MAXILLOFACIAL FINDINGS

Osseous: No fracture or mandibular dislocation. No destructive
process.

Orbits: Negative. No traumatic or inflammatory finding.

Sinuses: Clear.

Soft tissues: Soft tissue contusion and hematoma overlying the [place] (series 2, image 6).
IMPRESSION: 1. No acute intracranial pathology.
2. No displaced fracture or dislocation of the facial bones.
3. Soft tissue contusion and hematoma overlying the [place].

## 2024-02-17 IMAGING — CT CT CHEST W/O CM
2 of 4 series · 15 of 36 positions shown, 18 images · non-contrast
Comparison: CTA chest, 01/30/2022.

CLINICAL DATA: Follow-up pneumonia.



[Series 2: routine chest without · axial · non-contrast · 0.64mm/px · z∈[-216,+70]mm · 12 of 170 slices shown, 15 images]
[im 14/170  mediastinal]
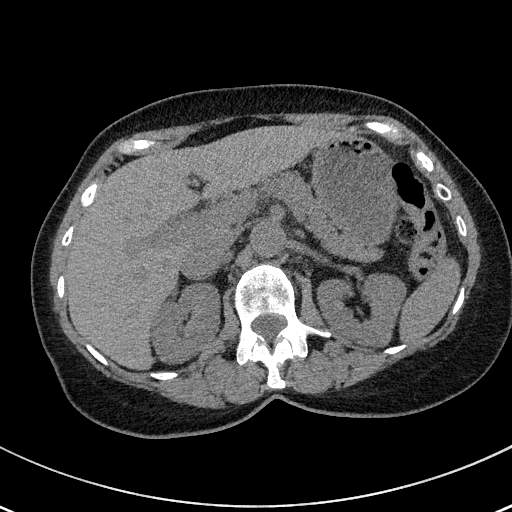
[im 14/170  lung]
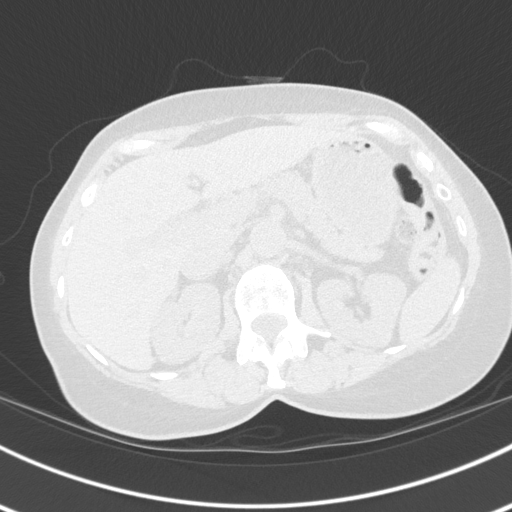
[im 27/170  lung]
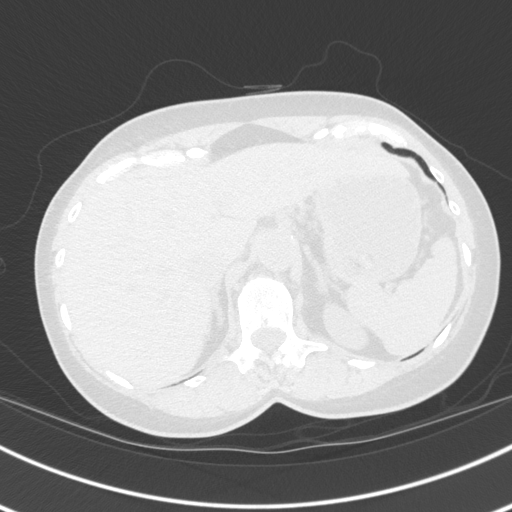
[im 40/170  lung]
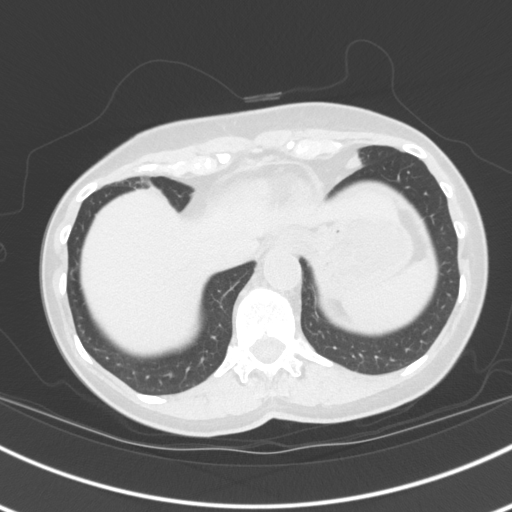
[im 53/170  lung]
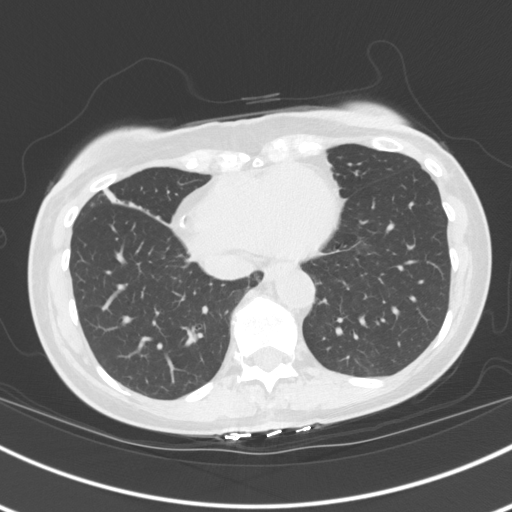
[im 66/170  mediastinal]
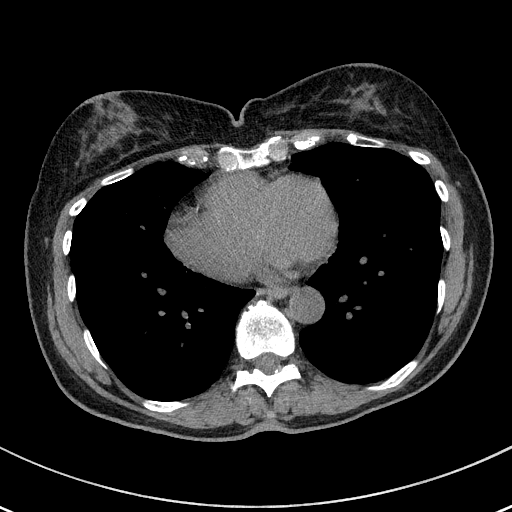
[im 66/170  lung]
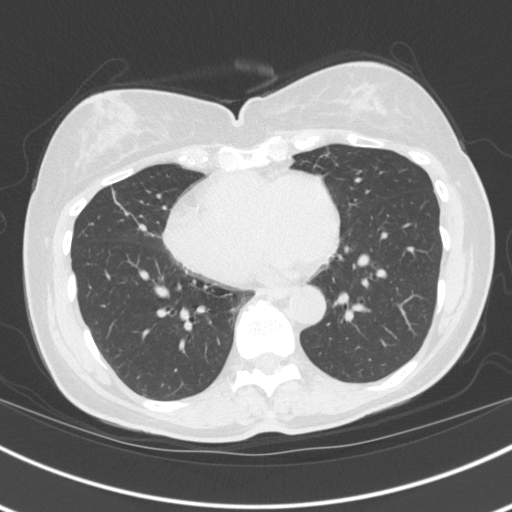
[im 79/170  lung]
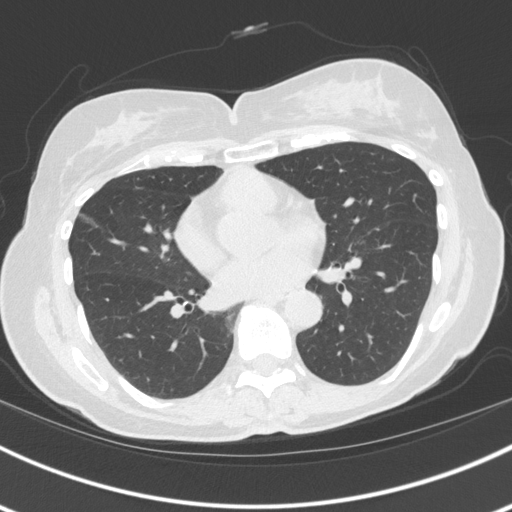
[im 92/170  lung]
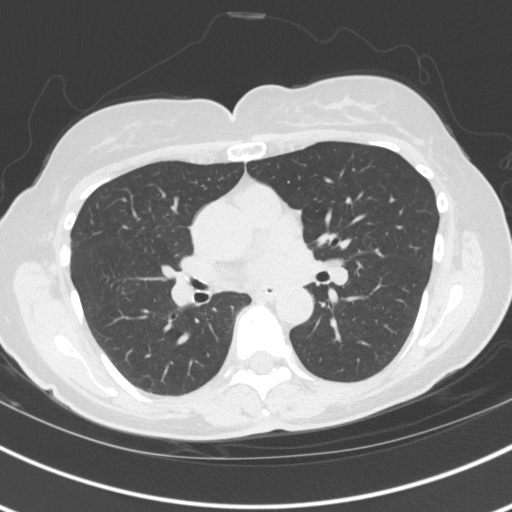
[im 105/170  lung]
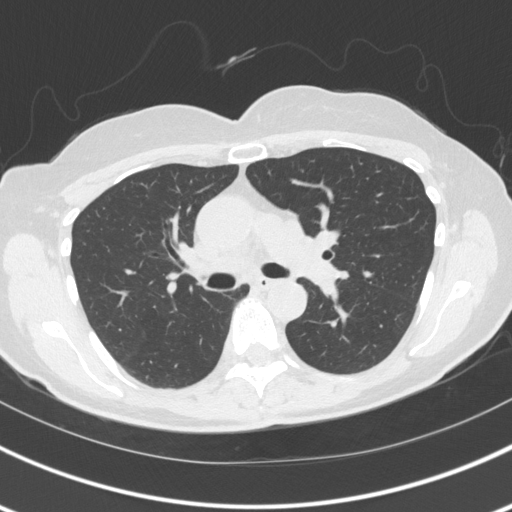
[im 118/170  mediastinal]
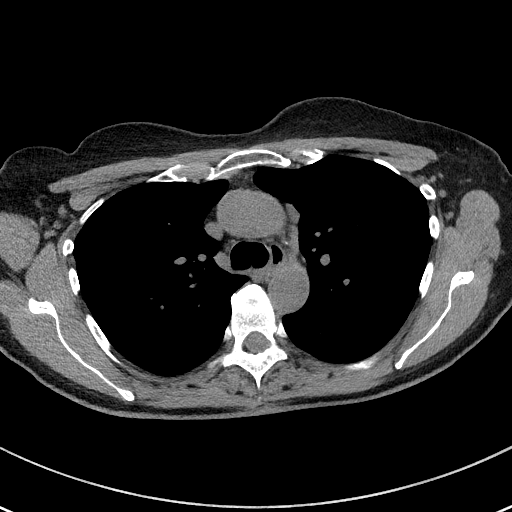
[im 118/170  lung]
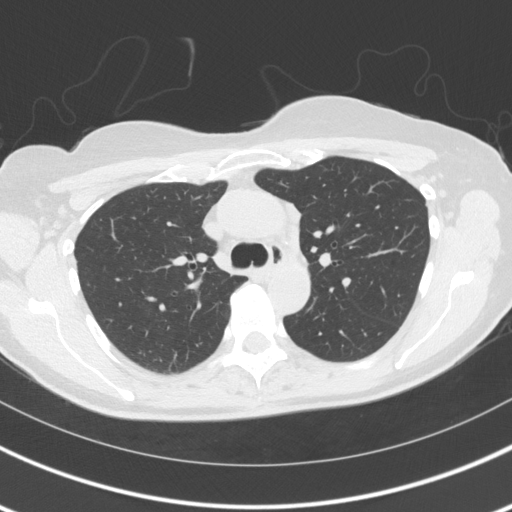
[im 131/170  lung]
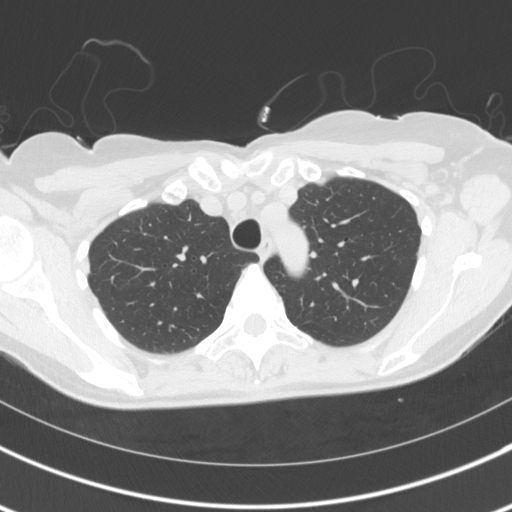
[im 144/170  lung]
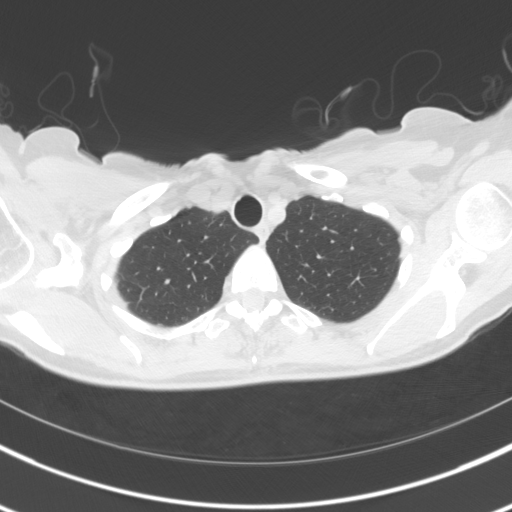
[im 157/170  lung]
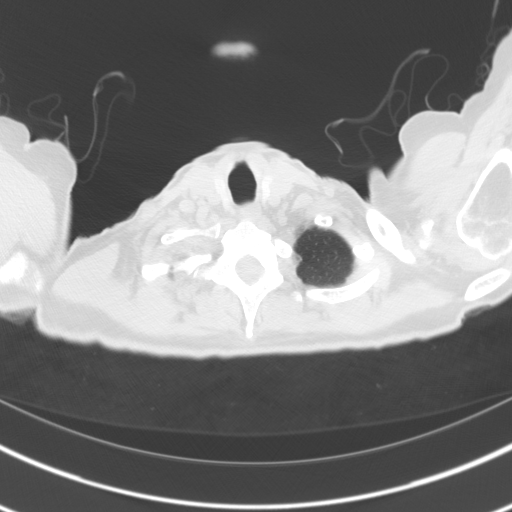

[Series 5: coronal · coronal · 0.67mm/px · 3 of 120 slices shown]
[im 24/120  lung]
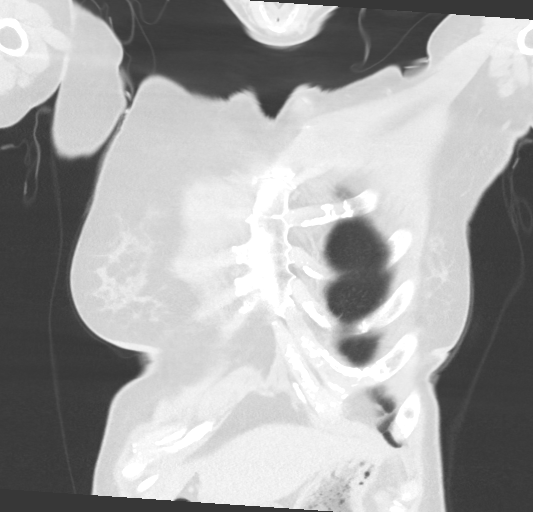
[im 48/120  lung]
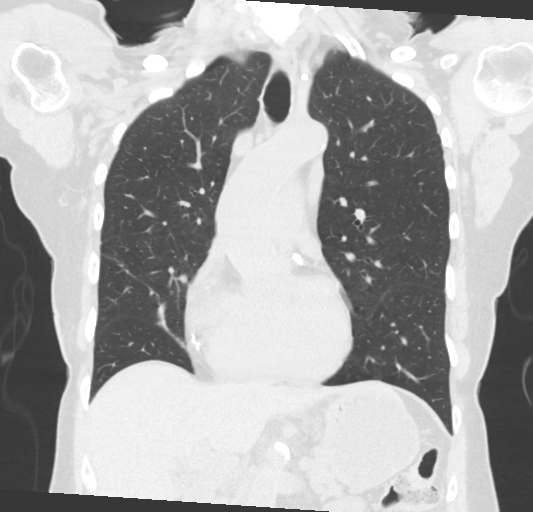
[im 72/120  lung]
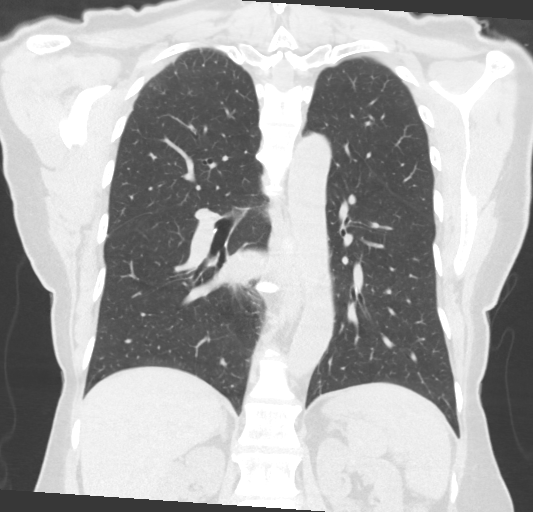

[15 of 36 positions shown; findings below may reference images not displayed]

FINDINGS: Cardiovascular: Heart normal in size. Dense left coronary artery
calcifications. Mild right coronary artery calcifications. No
pericardial effusion. Unremarkable great vessels.

Mediastinum/Nodes: No enlarged mediastinal or axillary lymph nodes.
Thyroid gland, trachea, and esophagus demonstrate no significant
findings.

Lungs/Pleura: Patchy areas of predominantly ground-glass opacity
noted in the lungs on the prior exam have resolved. Linear opacity
is noted in the right middle lobe consistent with subsegmental
atelectasis. Remainder of the lungs is clear.

No pleural effusion or pneumothorax.

Upper Abdomen: Unremarkable.

Musculoskeletal: No fracture or acute finding. No bone lesion. No
chest wall mass.
IMPRESSION: 1. Resolved lung opacities consistent with resolved multifocal
pneumonia.
2. Minor linear subsegmental right middle lobe atelectasis. Lungs
otherwise clear.
3. Coronary artery calcifications.
